# Patient Record
Sex: Male | Born: 2000 | Race: White | Hispanic: No
Health system: Southern US, Community
[De-identification: ages and names within clinical notes are randomized; demographics above are authoritative.]

## PROBLEM LIST (undated history)

## (undated) ENCOUNTER — Emergency Department (HOSPITAL_COMMUNITY): Admission: EM | Discharge status: Absent without leave | Payer: 59

## (undated) DIAGNOSIS — J45909 Unspecified asthma, uncomplicated: Secondary | ICD-10-CM

## (undated) DIAGNOSIS — I499 Cardiac arrhythmia, unspecified: Secondary | ICD-10-CM

## (undated) DIAGNOSIS — I10 Essential (primary) hypertension: Secondary | ICD-10-CM

## (undated) HISTORY — PX: RHINOPLASTY: SUR1284

## (undated) HISTORY — DX: Cardiac arrhythmia, unspecified: I49.9

## (undated) HISTORY — DX: Essential (primary) hypertension: I10

## (undated) HISTORY — DX: Unspecified asthma, uncomplicated: J45.909

---
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
## 2021-02-18 ENCOUNTER — Encounter: Payer: Self-pay | Admitting: Emergency Medicine

## 2021-02-18 ENCOUNTER — Ambulatory Visit (INDEPENDENT_AMBULATORY_CARE_PROVIDER_SITE_OTHER): Payer: 59 | Admitting: Emergency Medicine

## 2021-02-18 ENCOUNTER — Other Ambulatory Visit: Payer: Self-pay

## 2021-02-18 VITALS — BP 122/62 | HR 91 | Temp 98.2°F | Ht 69.0 in | Wt 182.0 lb

## 2021-02-18 DIAGNOSIS — R4582 Worries: Secondary | ICD-10-CM | POA: Diagnosis not present

## 2021-02-18 DIAGNOSIS — R300 Dysuria: Secondary | ICD-10-CM

## 2021-02-18 DIAGNOSIS — Z113 Encounter for screening for infections with a predominantly sexual mode of transmission: Secondary | ICD-10-CM | POA: Diagnosis not present

## 2021-02-18 LAB — POCT URINALYSIS DIP (MANUAL ENTRY)
Bilirubin, UA: NEGATIVE
Blood, UA: NEGATIVE
Glucose, UA: NEGATIVE mg/dL
Ketones, POC UA: NEGATIVE mg/dL
Leukocytes, UA: NEGATIVE
Nitrite, UA: NEGATIVE
Protein Ur, POC: NEGATIVE mg/dL
Spec Grav, UA: >=1.03 — AB (ref 1.010–1.025)
Urobilinogen, UA: 0.2 E.U./dL
pH, UA: 6 (ref 5.0–8.0)

## 2021-02-18 NOTE — Patient Instructions (Signed)
Preventing Sexually Transmitted Infections, Adult  Sexually transmitted infections (STIs) are diseases that are spread from person to person (are contagious). They are spread, or transmitted, through bodily fluids exchanged during sex or sexual contact. These bodily fluids include saliva, semen, blood, vaginal mucus, and urine. STIs are very common among people of all ages.  Some common STIs include:  Herpes.  Hepatitis B.  Chlamydia.  Gonorrhea.  Syphilis.  HPV (human papillomavirus).  HIV, also called the human immunodeficiency virus. This is the virus that can cause AIDS (acquired immunodeficiency syndrome).  Often, people who have these STIs do not have symptoms. Even without symptoms, these infections can be spread from person to person and require treatment.  How can these conditions affect me?  STIs can be treated, and many STIs can be cured. However, some STIs cannot be cured and will affect you for the rest of your life.  Certain STIs may:  Require you to take medicine for the rest of your life.  Affect your ability to have children (your fertility).  Increase your risk for developing another STI or certain serious health conditions. These may include:  Cervical cancer.  Head and neck cancer.  Pelvic inflammatory disease (PID), in women.  Organ damage or damage to other parts of your body, if the infection spreads.  Cause problems during pregnancy and may be transmitted to the baby during the pregnancy or childbirth.  What can increase my risk?  You may have an increased risk for developing an STI if:  You have unprotected sex. Sex includes oral, vaginal, or anal sex.  You have more than one sex partner.  You have a sex partner who has multiple sex partners.  You have sex with someone who has an STI.  You have an STI or you had an STI before.  You inject drugs or have a sex partner or partners who inject drugs.  What actions can I take to prevent STIs?  The only way to completely prevent STIs is not to have  sex of any kind. This is called practicing abstinence. If you are sexually active, you can protect yourself and others by taking these actions to lower your risk of getting an STI:  Lifestyle  Avoid mixing alcohol, drugs, and sex. Alcohol and drug use can affect your ability to make good decisions and can lead to risky sexual behaviors.  Medicines  Ask your health care provider about taking pre-exposure prophylaxis (PrEP) to prevent HIV infection.  General information    Stay up to date on vaccinations. Certain vaccines can lower your risk of getting certain STIs, such as:  Hepatitis A and hepatitis B vaccines. You may have been vaccinated as a young child, but you will likely need a booster shot as a teen or young adult.  HPV (human papillomavirus) vaccine.  Have only one sex partner (be monogamous) or limit the number of sex partners you have.  Use methods that prevent the exchange of body fluids between partners (barrier protection) correctly every time you have sex. Barrier protection can be used during oral, vaginal, or anal sex. Commonly used barrier methods include:  Male condom.  Male condom.  Dental dam.  Use a new condom for every sex act from start to finish.  Get tested for STIs. Have your partners get tested, too.  If you test positive for an STI, follow recommendations from your health care provider about treatment and make sure your sex partners are tested and treated.  Birth control   pills, injections, implants, and intrauterine devices (IUDs) do not protect against STIs. To prevent both STIs and pregnancy, always use a condom with another form of birth control.  Some STIs, such as herpes, are spread through skin-to-skin contact. A condom may not protect you from getting such STIs. Avoid all sexual contact if you or your partners have herpes and there is an active flare with open sores.  Where to find more information  Learn more about STIs from:  Centers for Disease Control and Prevention:  More  information about specific STIs: cdc.gov/std  Places to get sexual health counseling and treatment for free or at a low cost: gettested.cdc.gov  [place] Department of Health and Human Services: www.womenshealth.gov  Summary  Sexually transmitted infections (STIs) can spread through exchanging bodily fluids during sexual contact. Fluids include saliva, semen, blood, vaginal mucus, and urine.  You may have an increased risk for developing an STI if you have unprotected sex. Sex includes oral, vaginal, or anal sex.  If you do have sex, limit your number of sex partners and use barrier protection every time you have sex.  This information is not intended to replace advice given to you by your health care provider. Make sure you discuss any questions you have with your health care provider.  Document Revised: 05/02/2019 Document Reviewed: 05/02/2019  Elsevier Patient Education  2022 [employer]

## 2021-02-18 NOTE — Progress Notes (Signed)
Jackson Trevino 20 y.o.   Chief Complaint  Patient presents with   screening for STD    Dysuria ,x 2 weeks    HISTORY OF PRESENT ILLNESS: This is a 20 y.o. male concerned about possible STD. Married.  Wife had gonorrhea couple months ago. Denies penile discharge.  Occasional burning for the past couple weeks.  It comes and goes. No other associated symptoms.  HPI   Prior to Admission medications   Not on File    No Known Allergies  Patient Active Problem List   Diagnosis Date Noted   Asperger's disorder 09/06/2020   Chronic depression 09/06/2020   Connective tissue disorder ([place]) 09/06/2020   Abnormal colonoscopy 02/28/2019   Elevated blood pressure reading 02/28/2019   Gastroesophageal reflux disease with esophagitis without hemorrhage 02/28/2019   S/P rhinoplasty 12/08/2017   Chigger bites 10/18/2012   Body mass index, pediatric, 5th percentile to less than 85th percentile for age 66/27/2014   Constipation 03/13/2011   History of gastroesophageal reflux (GERD) 03/13/2011   History of rectal bleeding 03/13/2011   ADHD (attention deficit hyperactivity disorder) 07/10/2009   Childhood tic disorder 05/04/2008   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  No past surgical history on file.  Social History   Socioeconomic History   Marital status: Married    Spouse name: Not on file   Number of children: Not on file   Years of education: Not on file   Highest education level: Not on file  Occupational History   Not on file  Tobacco Use   Smoking status: Never   Smokeless tobacco: Never  Substance and Sexual Activity   Alcohol use: Not on file   Drug use: Not on file   Sexual activity: Not on file  Other Topics Concern   Not on file  Social History Narrative   Not on file   Social Determinants of Health   Financial Resource Strain: Not on file  Food Insecurity: Not on file  Transportation  Needs: Not on file  Physical Activity: Not on file  Stress: Not on file  Social Connections: Not on file  Intimate Partner Violence: Not on file    No family history on file.   Review of Systems  Constitutional: Negative.  Negative for chills and fever.  HENT: Negative.  Negative for congestion and sore throat.   Respiratory: Negative.  Negative for cough and shortness of breath.   Cardiovascular: Negative.  Negative for chest pain and palpitations.  Gastrointestinal:  Negative for abdominal pain, diarrhea, nausea and vomiting.  Genitourinary:  Positive for dysuria. Negative for flank pain, frequency and hematuria.  Skin: Negative.  Negative for rash.  Neurological: Negative.  Negative for dizziness and headaches.  All other systems reviewed and are negative. Today's Vitals   02/18/21 0849  BP: 122/62  Pulse: 91  Temp: 98.2 F (36.8 C)  TempSrc: Oral  SpO2: 99%  Weight: 182 lb (82.6 kg)  Height: 5\' 9"  (1.753 m)   Body mass index is 26.88 kg/m.   Physical Exam Vitals reviewed.  Constitutional:      Appearance: Normal appearance.  HENT:     Head: Normocephalic.  Eyes:     Extraocular Movements: Extraocular movements intact.  Cardiovascular:     Rate and Rhythm: Normal rate.  Pulmonary:     Effort: Pulmonary effort is normal.  Musculoskeletal:        General: Normal range of motion.  Cervical back: Normal range of motion and neck supple.  Skin:    General: Skin is warm and dry.  Neurological:     General: No focal deficit present.     Mental Status: He is alert and oriented to person, place, and time.  Psychiatric:        Mood and Affect: Mood normal.        Behavior: Behavior normal.   Results for orders placed or performed in visit on 02/18/21 (from the past 24 hour(s))  POCT urinalysis dipstick     Status: Abnormal   Collection Time: 02/18/21  9:20 AM  Result Value Ref Range   Color, UA yellow yellow   Clarity, UA clear clear   Glucose, UA negative  negative mg/dL   Bilirubin, UA negative negative   Ketones, POC UA negative negative mg/dL   Spec Grav, UA >=1.030 (A) 1.010 - 1.025   Blood, UA negative negative   pH, UA 6.0 5.0 - 8.0   Protein Ur, POC negative negative mg/dL   Urobilinogen, UA 0.2 0.2 or 1.0 E.U./dL   Nitrite, UA Negative Negative   Leukocytes, UA Negative Negative      ASSESSMENT & PLAN: A total of 30 minutes was spent with the patient and counseling/coordination of care regarding preparing for this visit, review of most recent office visit notes, differential diagnosis of dysuria, management and prevention of STDs, review of urinalysis results, prognosis, documentation and need for blood work and follow-up.  Clinically stable.  No signs of active urinary infection. STD screening done today. No red flag signs or symptoms.  Problem List Items Addressed This Visit   None Visit Diagnoses     Dysuria    -  Primary   Screening for STD (sexually transmitted disease)       Relevant Orders   POCT urinalysis dipstick (Completed)   Urine Culture   RPR   HIV Antibody (routine testing w rflx)   Hepatitis B surface antigen   HCV Ab w Reflex to Quant PCR   GC/Chlamydia Probe Amp   Worries          Patient Instructions  Preventing Sexually Transmitted Infections, Adult Sexually transmitted infections (STIs) are diseases that are spread from person to person (are contagious). They are spread, or transmitted, through bodily fluids exchanged during sex or sexual contact. These bodily fluids include saliva, semen, blood, vaginal mucus, and urine. STIs are very common among people of all ages. Some common STIs include: Herpes. Hepatitis B. Chlamydia. Gonorrhea. Syphilis. HPV (human papillomavirus). HIV, also called the human immunodeficiency virus. This is the virus that can cause AIDS (acquired immunodeficiency syndrome). Often, people who have these STIs do not have symptoms. Even without symptoms, these  infections can be spread from person to person and require treatment. How can these conditions affect me? STIs can be treated, and many STIs can be cured. However, some STIs cannot be cured and will affect you for the rest of your life. Certain STIs may: Require you to take medicine for the rest of your life. Affect your ability to have children (your fertility). Increase your risk for developing another STI or certain serious health conditions. These may include: Cervical cancer. Head and neck cancer. Pelvic inflammatory disease (PID), in women. Organ damage or damage to other parts of your body, if the infection spreads. Cause problems during pregnancy and may be transmitted to the baby during the pregnancy or childbirth. What can increase my risk? You may have an  increased risk for developing an STI if: You have unprotected sex. Sex includes oral, vaginal, or anal sex. You have more than one sex partner. You have a sex partner who has multiple sex partners. You have sex with someone who has an STI. You have an STI or you had an STI before. You inject drugs or have a sex partner or partners who inject drugs. What actions can I take to prevent STIs? The only way to completely prevent STIs is not to have sex of any kind. This is called practicing abstinence. If you are sexually active, you can protect yourself and others by taking these actions to lower your risk of getting an STI: Lifestyle Avoid mixing alcohol, drugs, and sex. Alcohol and drug use can affect your ability to make good decisions and can lead to risky sexual behaviors. Medicines Ask your health care provider about taking pre-exposure prophylaxis (PrEP) to prevent HIV infection. General information  Stay up to date on vaccinations. Certain vaccines can lower your risk of getting certain STIs, such as: Hepatitis A and hepatitis B vaccines. You may have been vaccinated as a young child, but you will likely need a booster  shot as a teen or young adult. HPV (human papillomavirus) vaccine. Have only one sex partner (be monogamous) or limit the number of sex partners you have. Use methods that prevent the exchange of body fluids between partners (barrier protection) correctly every time you have sex. Barrier protection can be used during oral, vaginal, or anal sex. Commonly used barrier methods include: Male condom. Male condom. Dental dam. Use a new condom for every sex act from start to finish. Get tested for STIs. Have your partners get tested, too. If you test positive for an STI, follow recommendations from your health care provider about treatment and make sure your sex partners are tested and treated. Birth control pills, injections, implants, and intrauterine devices (IUDs) do not protect against STIs. To prevent both STIs and pregnancy, always use a condom with another form of birth control. Some STIs, such as herpes, are spread through skin-to-skin contact. A condom may not protect you from getting such STIs. Avoid all sexual contact if you or your partners have herpes and there is an active flare with open sores. Where to find more information Learn more about STIs from: Centers for Disease Control and Prevention: More information about specific STIs: BloggingList.ca Places to get sexual health counseling and treatment for free or at a low cost: gettested.TonerPromos.no U.S. Department of Health and Human Services: http://hoffman.com/ Summary Sexually transmitted infections (STIs) can spread through exchanging bodily fluids during sexual contact. Fluids include saliva, semen, blood, vaginal mucus, and urine. You may have an increased risk for developing an STI if you have unprotected sex. Sex includes oral, vaginal, or anal sex. If you do have sex, limit your number of sex partners and use barrier protection every time you have sex. This information is not intended to replace advice given to you by your health  care provider. Make sure you discuss any questions you have with your health care provider. Document Revised: 05/02/2019 Document Reviewed: 05/02/2019 Elsevier Patient Education  2022 Elsevier Inc.     Edwina Barth, MD [facility] at [facility]

## 2021-02-19 LAB — HIV ANTIBODY (ROUTINE TESTING W REFLEX): HIV 1&2 Ab, 4th Generation: NONREACTIVE

## 2021-02-19 LAB — HCV AB W REFLEX TO QUANT PCR: HCV Ab: <0.1 s/co ratio (ref 0.0–0.9)

## 2021-02-19 LAB — URINE CULTURE

## 2021-02-19 LAB — HCV INTERPRETATION

## 2021-02-19 LAB — RPR: RPR Ser Ql: NONREACTIVE

## 2021-02-19 LAB — HEPATITIS B SURFACE ANTIGEN: Hepatitis B Surface Ag: NONREACTIVE

## 2021-02-20 LAB — GC/CHLAMYDIA PROBE AMP
Chlamydia trachomatis, NAA: NEGATIVE
Neisseria Gonorrhoeae by PCR: NEGATIVE

## 2021-06-10 ENCOUNTER — Ambulatory Visit (INDEPENDENT_AMBULATORY_CARE_PROVIDER_SITE_OTHER): Payer: 59 | Admitting: Emergency Medicine

## 2021-06-10 ENCOUNTER — Other Ambulatory Visit: Payer: Self-pay

## 2021-06-10 ENCOUNTER — Encounter: Payer: Self-pay | Admitting: Emergency Medicine

## 2021-06-10 VITALS — BP 130/62 | HR 84 | Ht 69.0 in | Wt 180.0 lb

## 2021-06-10 DIAGNOSIS — H9193 Unspecified hearing loss, bilateral: Secondary | ICD-10-CM | POA: Insufficient documentation

## 2021-06-10 HISTORY — DX: Unspecified hearing loss, bilateral: H91.93

## 2021-06-10 NOTE — Patient Instructions (Signed)
Hearing Loss ?Hearing loss is a partial or total loss of the ability to hear. This can be temporary or permanent, and it can happen in one or both ears. ?Medical care is necessary to treat hearing loss properly and to prevent the condition from getting worse. Your hearing may partially or completely come back, depending on what caused your hearing loss and how severe it is. In some cases, hearing loss is permanent. ?What are the causes? ?Common causes of hearing loss include: ?Too much wax in the ear canal. ?Infection of the ear canal or middle ear. ?Fluid in the middle ear. ?Injury to the ear or surrounding area. ?An object stuck in the ear. ?A history of prolonged exposure to loud sounds, such as music. ?Less common causes of hearing loss include: ?Tumors in the ear. ?Viral or bacterial infections, such as meningitis. ?A hole in the eardrum (perforated eardrum). ?Problems with the hearing nerve that sends signals between the brain and the ear. ?Certain medicines. ?What are the signs or symptoms? ?Symptoms of this condition may include: ?Difficulty telling the difference between sounds. ?Difficulty following a conversation when there is background noise. ?Lack of response to sounds in your environment. This may be most noticeable when you do not respond to startling sounds. ?Needing to turn up the volume on the television, radio, or other devices. ?Ringing in the ears. ?Dizziness. ?How is this diagnosed? ?This condition is diagnosed based on: ?A physical exam. ?A hearing test (audiometry). The audiometry test will be performed by a hearing specialist (audiologist). ?You may also be referred to an ear, nose, and throat (ENT) specialist (otolaryngologist). ?How is this treated? ?Treatment for hearing loss may include: ?Ear wax removal. ?Medicines to treat or prevent infection (antibiotics). ?Medicines to reduce inflammation (corticosteroids). ?Hearing aids for hearing loss related to nerve damage. ?Follow these  instructions at home: ?If you were prescribed an antibiotic medicine, take it as told by your health care provider. Do not stop taking the antibiotic even if you start to feel better. ?Take over-the-counter and prescription medicines only as told by your health care provider. ?Avoid loud noises. ?Return to your normal activities as told by your health care provider. Ask your health care provider what activities are safe for you. ?Keep all follow-up visits as told by your health care provider. This is important. ?Contact a health care provider if: ?You feel dizzy. ?You develop new symptoms. ?You vomit or feel nauseous. ?You have a fever. ?Get help right away if: ?You develop sudden changes in your vision. ?You have severe ear pain. ?You have new or increased weakness. ?You have a severe headache. ?Summary ?Hearing loss is a decreased ability to hear sounds around you. It can be temporary or permanent. ?Treatment will depend on the cause of your hearing loss. It may include ear wax removal, medicines, or a hearing aid. ?Your hearing may partially or completely come back, depending on what caused your hearing loss and how severe it is. ?Keep all follow-up visits as told by your health care provider. This is important. ?This information is not intended to replace advice given to you by your health care provider. Make sure you discuss any questions you have with your health care provider. ?Document Revised: 12/15/2017 Document Reviewed: 12/15/2017 ?Elsevier Patient Education ? 2022 Elsevier Inc. ? ?

## 2021-06-10 NOTE — Progress Notes (Signed)
[place] ?21 y.o. ? ? ?Chief Complaint  ?Patient presents with  ? Referral  ?  Audiology, pt states he is hard on hearing  ? ? ?HISTORY OF PRESENT ILLNESS: ?This is a 21 y.o. male complaining of hearing loss which was first detected in middle school. ?Requesting referral to audiology and/or ENT. ?No other complaints or medical concerns today. ? ?HPI ? ? ?Prior to Admission medications   ?Not on File  ? ? ?No Known Allergies ? ?Patient Active Problem List  ? Diagnosis Date Noted  ? Asperger's disorder 09/06/2020  ? Chronic depression 09/06/2020  ? Connective tissue disorder ([place]) 09/06/2020  ? Abnormal colonoscopy 02/28/2019  ? Elevated blood pressure reading 02/28/2019  ? Gastroesophageal reflux disease with esophagitis without hemorrhage 02/28/2019  ? S/P rhinoplasty 12/08/2017  ? Chigger bites 10/18/2012  ? Body mass index, pediatric, 5th percentile to less than 85th percentile for age 42/27/2014  ? Constipation 03/13/2011  ? History of gastroesophageal reflux (GERD) 03/13/2011  ? History of rectal bleeding 03/13/2011  ? ADHD (attention deficit hyperactivity disorder) 07/10/2009  ? Childhood tic disorder 05/04/2008  ? 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 ? ?No past medical history on file. ? ?No past surgical history on file. ? ?Social History  ? ?Socioeconomic History  ? Marital status: Married  ?  Spouse name: Not on file  ? Number of children: Not on file  ? Years of education: Not on file  ? Highest education level: Not on file  ?Occupational History  ? Not on file  ?Tobacco Use  ? Smoking status: Never  ? Smokeless tobacco: Never  ?Substance and Sexual Activity  ? Alcohol use: Not on file  ? Drug use: Not on file  ? Sexual activity: Not on file  ?Other Topics Concern  ? Not on file  ?Social History Narrative  ? Not on file  ? ?Social Determinants of Health  ? ?Financial Resource Strain: Not on file  ?Food Insecurity: Not on file  ?Transportation Needs: Not on file   ?Physical Activity: Not on file  ?Stress: Not on file  ?Social Connections: Not on file  ?Intimate Partner Violence: Not on file  ? ? ?No family history on file. ? ? ?Review of Systems  ?Constitutional: Negative.  Negative for chills and fever.  ?HENT:  Positive for hearing loss.   ?Respiratory: Negative.    ?Cardiovascular: Negative.   ?Gastrointestinal:  Negative for nausea and vomiting.  ?Skin: Negative.  Negative for rash.  ?Neurological:  Negative for dizziness and headaches.  ?All other systems reviewed and are negative. ?Today's Vitals  ? 06/10/21 1553  ?BP: 130/62  ?Pulse: 84  ?SpO2: 99%  ?Weight: 180 lb (81.6 kg)  ?Height: 5\' 9"  (1.753 m)  ? ?Body mass index is 26.58 kg/m?. ? ? ?Physical Exam ?Vitals reviewed.  ?Constitutional:   ?   Appearance: Normal appearance.  ?HENT:  ?   Head: Normocephalic.  ?   Right Ear: Tympanic membrane, ear canal and external ear normal.  ?   Left Ear: Tympanic membrane, ear canal and external ear normal.  ?Eyes:  ?   Extraocular Movements: Extraocular movements intact.  ?Cardiovascular:  ?   Rate and Rhythm: Normal rate.  ?Pulmonary:  ?   Effort: Pulmonary effort is normal.  ?Skin: ?   General: Skin is warm and dry.  ?   Capillary Refill: Capillary refill takes less than 2 seconds.  ?Neurological:  ?   General: No focal deficit  present.  ?   Mental Status: He is alert and oriented to person, place, and time.  ?Psychiatric:     ?   Mood and Affect: Mood normal.     ?   Behavior: Behavior normal.  ? ? ? ?ASSESSMENT & PLAN: ?Problem List Items Addressed This Visit   ? ?  ? Nervous and Auditory  ? Bilateral hearing loss - Primary  ? Relevant Orders  ? Ambulatory referral to ENT  ? Ambulatory referral to Audiology  ? ?Patient Instructions  ?Hearing Loss ?Hearing loss is a partial or total loss of the ability to hear. This can be temporary or permanent, and it can happen in one or both ears. ?Medical care is necessary to treat hearing loss properly and to prevent the condition from  getting worse. Your hearing may partially or completely come back, depending on what caused your hearing loss and how severe it is. In some cases, hearing loss is permanent. ?What are the causes? ?Common causes of hearing loss include: ?Too much wax in the ear canal. ?Infection of the ear canal or middle ear. ?Fluid in the middle ear. ?Injury to the ear or surrounding area. ?An object stuck in the ear. ?A history of prolonged exposure to loud sounds, such as music. ?Less common causes of hearing loss include: ?Tumors in the ear. ?Viral or bacterial infections, such as meningitis. ?A hole in the eardrum (perforated eardrum). ?Problems with the hearing nerve that sends signals between the brain and the ear. ?Certain medicines. ?What are the signs or symptoms? ?Symptoms of this condition may include: ?Difficulty telling the difference between sounds. ?Difficulty following a conversation when there is background noise. ?Lack of response to sounds in your environment. This may be most noticeable when you do not respond to startling sounds. ?Needing to turn up the volume on the television, radio, or other devices. ?Ringing in the ears. ?Dizziness. ?How is this diagnosed? ?This condition is diagnosed based on: ?A physical exam. ?A hearing test (audiometry). The audiometry test will be performed by a hearing specialist (audiologist). ?You may also be referred to an ear, nose, and throat (ENT) specialist (otolaryngologist). ?How is this treated? ?Treatment for hearing loss may include: ?Ear wax removal. ?Medicines to treat or prevent infection (antibiotics). ?Medicines to reduce inflammation (corticosteroids). ?Hearing aids for hearing loss related to nerve damage. ?Follow these instructions at home: ?If you were prescribed an antibiotic medicine, take it as told by your health care provider. Do not stop taking the antibiotic even if you start to feel better. ?Take over-the-counter and prescription medicines only as told by  your health care provider. ?Avoid loud noises. ?Return to your normal activities as told by your health care provider. Ask your health care provider what activities are safe for you. ?Keep all follow-up visits as told by your health care provider. This is important. ?Contact a health care provider if: ?You feel dizzy. ?You develop new symptoms. ?You vomit or feel nauseous. ?You have a fever. ?Get help right away if: ?You develop sudden changes in your vision. ?You have severe ear pain. ?You have new or increased weakness. ?You have a severe headache. ?Summary ?Hearing loss is a decreased ability to hear sounds around you. It can be temporary or permanent. ?Treatment will depend on the cause of your hearing loss. It may include ear wax removal, medicines, or a hearing aid. ?Your hearing may partially or completely come back, depending on what caused your hearing loss and how severe it is. ?  Keep all follow-up visits as told by your health care provider. This is important. ?This information is not intended to replace advice given to you by your health care provider. Make sure you discuss any questions you have with your health care provider. ?Document Revised: 12/15/2017 Document Reviewed: 12/15/2017 ?Elsevier Patient Education ? 2022 [place]. ? ? ? ? ?Agustina Caroli, MD ?[facility] at [facility] ?

## 2021-06-19 ENCOUNTER — Ambulatory Visit: Payer: 59 | Attending: Emergency Medicine | Admitting: Audiologist

## 2021-06-19 ENCOUNTER — Other Ambulatory Visit: Payer: Self-pay

## 2021-06-19 DIAGNOSIS — H903 Sensorineural hearing loss, bilateral: Secondary | ICD-10-CM | POA: Insufficient documentation

## 2021-06-19 NOTE — Procedures (Signed)
?  Outpatient [facility] and [place] ?[address] ?[place], [place]  [address] ?[phone] ? ?AUDIOLOGICAL  EVALUATION ? ?NAME: Jackson Trevino     ?DOB:   12-31-2000      ?MRN: [id number]                                                                                     ?DATE: 06/19/2021     ?REFERENT: Horald Pollen, MD ?STATUS: Outpatient ?DIAGNOSIS: Sensorineural Hearing Loss Bilateral   ? ?History: ?Shoichi was seen for an audiological evaluation.  ?[name] is receiving a hearing evaluation due to concerns for difficulty hearing at work over the radio and in loud noise. [name] has difficulty hearing in background noise, crowds, and when people are at a distance. This difficulty began in middle school. [name] says he was diagnosed with slight hearing loss in middle school. He was told its related to his connective tissue disorder. No pain or pressure reported in either ear. Tinnitus present in intermittently in both ears. No other relevant case history reported.  ? ?Evaluation:  ?Otoscopy showed a clear view of the tympanic membranes, bilaterally ?Tympanometry results were consistent with normal middle ear function, bilaterally   ?Audiometric testing was completed using conventional audiometry with insert transducer. Speech Recognition Thresholds were consistent with pure tone averages. Word Recognition was excellent at 40dB SL with masking. Pure tone thresholds show normal sloping to mild notched sensorineural hearing loss in both ears centered on 1k Hz. Test results are consistent with mild degree of loss in mid pitches. ? ?Results:  ?The test results were reviewed with Tomasz. He has a mild hearing loss in both ears. Due to the noisy environment he works in, and needing to hear over a low quality radio, this mild degree of loss is likely impacting him daily. He was given information about pursuing hearing aids even though his degree of hearing loss does not make him a traditional  candidate. Whether or not he gets hearing aids, Asante needs to have his hearing tested annually to monitor his hearing loss progression. He reported understanding all that was discussed. He was given a copy of hearing test today. ? ?Recommendations: ?Recommend annual audiometric evaluation to monitor progressive hearing loss.  ?Due to difficulty at work, Bartolome was given a list of local hearing aid providers and a handout on quality over the counter hearing aids currently on the market.  ?  ?Alfonse Alpers  ?Audiologist, Au.D., CCC-A ?06/19/2021  8:35 AM ? ?Cc: Horald Pollen, MD  ?

## 2021-07-17 ENCOUNTER — Telehealth: Payer: 59 | Admitting: Nurse Practitioner

## 2021-07-17 DIAGNOSIS — J189 Pneumonia, unspecified organism: Secondary | ICD-10-CM

## 2021-07-17 MED ORDER — AZITHROMYCIN 250 MG PO TABS: ORAL_TABLET | ORAL | 0 refills | Status: AC

## 2021-07-17 MED ORDER — ALBUTEROL SULFATE HFA 108 (90 BASE) MCG/ACT IN AERS: 2.0000 | INHALATION_SPRAY | Freq: Four times a day (QID) | RESPIRATORY_TRACT | 0 refills | Status: DC | PRN

## 2021-07-17 NOTE — Progress Notes (Signed)
?Virtual Visit Consent  ? ?Jackson Trevino, you are scheduled for a virtual visit with a [facility] provider today.   ?  ?Just as with appointments in the office, your consent must be obtained to participate.  Your consent will be active for this visit and any virtual visit you may have with one of our providers in the next 365 days.   ?  ?If you have a MyChart account, a copy of this consent can be sent to you electronically.  All virtual visits are billed to your insurance company just like a traditional visit in the office.   ? ?As this is a virtual visit, video technology does not allow for your provider to perform a traditional examination.  This may limit your provider's ability to fully assess your condition.  If your provider identifies any concerns that need to be evaluated in person or the need to arrange testing (such as labs, EKG, etc.), we will make arrangements to do so.   ?  ?Although advances in technology are sophisticated, we cannot ensure that it will always work on either your end or our end.  If the connection with a video visit is poor, the visit may have to be switched to a telephone visit.  With either a video or telephone visit, we are not always able to ensure that we have a secure connection.    ? ?I need to obtain your verbal consent now.   Are you willing to proceed with your visit today?  ?  ?[name] has provided verbal consent on 07/17/2021 for a virtual visit (video or telephone). ?  ?Viviano Simas, FNP  ? ?Date: 07/17/2021 9:08 AM ? ? ?Virtual Visit via Video Note  ? ?IViviano Simas, connected with  Jackson Trevino  ([id number], 10-05-2000) on 07/17/21 at  9:15 AM EDT by a video-enabled telemedicine application and verified that I am speaking with the correct person using two identifiers. ? ?Location: ?Patient: Virtual Visit Location Patient: Home ?Provider: Virtual Visit Location Provider: Home Office ?  ?I discussed the limitations of evaluation and  management by telemedicine and the availability of in person appointments. The patient expressed understanding and agreed to proceed.   ? ?History of Present Illness: ?Jackson Trevino is a 21 y.o. who identifies as a male who was assigned male at birth, and is being seen today with complaints of a sore throat, cough and nasal congestion for the past 4 days. He has started to experience some chest discomfort. He has tried dayquil for relief.  ? ?He did have asthma as a child  ?He denies a fever  ? 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 He was in contact with the patients with and without a mask.  ? ?He started to have symptoms within a day of contact with that patient  he was unmasked around that was diagnosed with CAP ? ?Denies COVID contacts  ?He has not been COVID tested  ? ? ? ? ?Problems:  ?Patient Active Problem List  ? Diagnosis Date Noted  ? Bilateral hearing loss 06/10/2021  ? Asperger's disorder 09/06/2020  ? Chronic depression 09/06/2020  ? Connective tissue disorder (HCC) 09/06/2020  ? Abnormal colonoscopy 02/28/2019  ? Elevated blood pressure reading 02/28/2019  ? Gastroesophageal reflux disease with esophagitis without hemorrhage 02/28/2019  ? S/P rhinoplasty 12/08/2017  ? Chigger bites 10/18/2012  ? Body mass index, pediatric, 5th percentile to less  than 85th percentile for age 74/27/2014  ? Constipation 03/13/2011  ? History of gastroesophageal reflux (GERD) 03/13/2011  ? History of rectal bleeding 03/13/2011  ? ADHD (attention deficit hyperactivity disorder) 07/10/2009  ? Childhood tic disorder 05/04/2008  ? 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  ?Allergies: No Known Allergies ?Medications: No current outpatient medications on file. ? ?Observations/Objective: ?Patient is well-developed, well-nourished in no acute distress.  ?Resting comfortably  at home.  ?Head is normocephalic, atraumatic.  ?No labored breathing.  ?Speech  is clear and coherent with logical content.  ?Patient is alert and oriented at baseline.  ? ? ?Assessment and Plan: ?1. Pneumonia due to infectious organism, unspecified laterality, unspecified part of lung ?Advised Mucinex OTC in addition to: ? ?- albuterol (VENTOLIN HFA) 108 (90 Base) MCG/ACT inhaler; Inhale 2 puffs into the lungs every 6 (six) hours as needed for wheezing or shortness of breath.  Dispense: 8 g; Refill: 0 ?- azithromycin (ZITHROMAX) 250 MG tablet; Take 2 tablets on day 1, then 1 tablet daily on days 2 through 5  Dispense: 6 tablet; Refill: 0 ?   ?Advised COVID testing as well  ?Follow Up Instructions: ?I discussed the assessment and treatment plan with the patient. The patient was provided an opportunity to ask questions and all were answered. The patient agreed with the plan and demonstrated an understanding of the instructions.  A copy of instructions were sent to the patient via MyChart unless otherwise noted below.  ? ?The patient was advised to call back or seek an in-person evaluation if the symptoms worsen or if the condition fails to improve as anticipated. ? ?Time:  ?I spent 10 minutes with the patient via telehealth technology discussing the above problems/concerns.   ? ?Viviano Simas, FNP  ?

## 2021-07-24 ENCOUNTER — Ambulatory Visit (INDEPENDENT_AMBULATORY_CARE_PROVIDER_SITE_OTHER): Payer: 59 | Admitting: Gastroenterology

## 2021-07-24 ENCOUNTER — Encounter: Payer: Self-pay | Admitting: Gastroenterology

## 2021-07-24 VITALS — BP 172/79 | HR 71 | Temp 98.0°F | Ht 69.0 in | Wt 179.0 lb

## 2021-07-24 DIAGNOSIS — K582 Mixed irritable bowel syndrome: Secondary | ICD-10-CM | POA: Diagnosis not present

## 2021-07-24 MED ORDER — DICYCLOMINE HCL 20 MG PO TABS: 20.0000 mg | ORAL_TABLET | Freq: Three times a day (TID) | ORAL | 2 refills | Status: DC

## 2021-07-24 NOTE — Progress Notes (Signed)
? ? ?Gastroenterology Consultation ? ?Referring Provider:     Georgina Quint, * ?Primary Care Physician:  Georgina Quint, MD ?Primary Gastroenterologist:  Dr. Servando Snare     ?Reason for Consultation:     Irritable bowel syndrome ?      ? HPI:   ?Jackson Trevino is a 21 y.o. y/o male referred for consultation & management of irritable bowel syndrome by Dr. Alvy Bimler, Eilleen Kempf, MD. this patient comes in today with a history of irritable bowel syndrome that he states has been going on since he was 21 years old.  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  He also has bloating and abdominal pain.  He has no family history of colon cancer or colon polyps.  The patient also denies any unexplained weight loss fevers chills nausea or vomiting.  The patient is on Prilosec for heartburn.  He has never been tried on any antispasmodic medication and states he is looking for something to help with these frequent changes in his bowel movements. ? ?No past medical history on file. ? ?No past surgical history on file. ? ?Prior to Admission medications   ?Medication Sig Start Date End Date Taking? Authorizing Provider  ?albuterol (VENTOLIN HFA) 108 (90 Base) MCG/ACT inhaler Inhale 2 puffs into the lungs every 6 (six) hours as needed for wheezing or shortness of breath. 07/17/21   Viviano Simas, FNP  ? ? ?No family history on file.  ? ?Social History  ? ?Tobacco Use  ? Smoking status: Never  ? Smokeless tobacco: Never  ? ? ?Allergies as of 07/24/2021 - Review Complete 07/17/2021  ?Allergen Reaction Noted  ? Azithromycin Hives and Itching 07/24/2021  ? ? ?Review of Systems:    ?All systems reviewed and negative except where noted in HPI. ? ? Physical Exam:  ?BP (!) 172/79   Pulse 71   Temp 98 ?F (36.7 ?C) (Oral)   Ht 5\' 9"  (1.753 m)   Wt 179 lb  (81.2 kg)   BMI 26.43 kg/m?  ?No LMP for male patient. ?General:   Alert,  Well-developed, well-nourished, pleasant and cooperative in NAD ?Head:  Normocephalic and atraumatic. ?Eyes:  Sclera clear, no icterus.   Conjunctiva pink. ?Ears:  Normal auditory acuity. ?Neck:  Supple; no masses or thyromegaly. ?Lungs:  Respirations even and unlabored.  Clear throughout to auscultation.   No wheezes, crackles, or rhonchi. No acute distress. ?Heart:  Regular rate and rhythm; no murmurs, clicks, rubs, or gallops. ?Abdomen:  Normal bowel sounds.  No bruits.  Soft, non-tender and non-distended without masses, hepatosplenomegaly or hernias noted.  No guarding or rebound tenderness.  Negative Carnett sign.   ?Rectal:  Deferred.  ?Pulses:  Normal pulses noted. ?Extremities:  No clubbing or edema.  No cyanosis. ?Neurologic:  Alert and oriented x3;  grossly normal neurologically. ?Skin:  Intact without significant lesions or rashes.  No jaundice. ?Lymph Nodes:  No significant cervical adenopathy. ?Psych:  Alert and cooperative. Normal mood and affect. ? ?Imaging Studies: ?No results found. ? ?Assessment and Plan:  ? ?Czar Kalyb Pemble is a 21 y.o. y/o male who comes in today with signs and symptoms of irritable bowel syndrome with alternating diarrhea and constipation and bloating.  The patient has been informed about a low FODMAP diet.  The patient has also been told to start a probiotic and start a fiber supplementation with Citrucel.  The patient will also be started on dicyclomine 20 mg 3 times a day. The patient has also been told that if these do not work he may be considered for a tricyclic antidepressant before bedtime to help with his irritable bowel syndrome.  The patient has been explained the plan and agrees with it.  He will contact me if his symptoms do not improve. ? ? ? ?Midge Minium, MD. Clementeen Graham ? ? ? Note: This dictation was prepared with Dragon dictation along with smaller phrase technology. Any transcriptional  errors that result from this process are unintentional.   ?

## 2021-08-08 ENCOUNTER — Other Ambulatory Visit: Payer: Self-pay | Admitting: Nurse Practitioner

## 2021-08-08 DIAGNOSIS — J189 Pneumonia, unspecified organism: Secondary | ICD-10-CM

## 2021-08-15 ENCOUNTER — Ambulatory Visit: Payer: 59 | Admitting: Physician Assistant

## 2021-08-15 ENCOUNTER — Encounter: Payer: Self-pay | Admitting: Physician Assistant

## 2021-08-15 VITALS — BP 124/73 | HR 66 | Temp 97.7°F | Ht 69.0 in | Wt 177.0 lb

## 2021-08-15 DIAGNOSIS — K582 Mixed irritable bowel syndrome: Secondary | ICD-10-CM

## 2021-08-15 DIAGNOSIS — F909 Attention-deficit hyperactivity disorder, unspecified type: Secondary | ICD-10-CM

## 2021-08-15 DIAGNOSIS — Z7689 Persons encountering health services in other specified circumstances: Secondary | ICD-10-CM | POA: Diagnosis not present

## 2021-08-15 MED ORDER — AMPHETAMINE-DEXTROAMPHET ER 10 MG PO CP24: 10.0000 mg | ORAL_CAPSULE | Freq: Every day | ORAL | 0 refills | Status: DC

## 2021-08-15 NOTE — Patient Instructions (Signed)
Living With Attention Deficit Hyperactivity Disorder If you have been diagnosed with attention deficit hyperactivity disorder (ADHD), you may be relieved that you now know why you have felt or behaved a certain way. Still, you may feel overwhelmed about the treatment ahead. You may also wonder how to get the support you need and how to deal with the condition day-to-day. With treatment and support, you can live with ADHD and manage your symptoms. How to manage lifestyle changes Managing stress Stress is your body's reaction to life changes and events, both good and bad. To cope with the stress of an ADHD diagnosis, it may help to: Learn more about ADHD. Exercise regularly. Even a short daily walk can lower stress levels. Participate in training or education programs (including social skills training classes) that teach you to deal with symptoms.  Medicines Your health care provider may suggest certain medicines if he or she feels that they will help to improve your condition. Stimulant medicines are usually prescribed to treat ADHD, and therapy may also be prescribed. It is important to: Avoid using alcohol and other substances that may prevent your medicines from working properly (may interact). Talk with your pharmacist or health care provider about all the medicines that you take, their possible side effects, and what medicines are safe to take together. Make it your goal to take part in all treatment decisions (shared decision-making). Ask about possible side effects of medicines that your health care provider recommends, and tell him or her how you feel about having those side effects. It is best if shared decision-making with your health care provider is part of your total treatment plan. Relationships To strengthen your relationships with family members while treating your condition, consider taking part in family therapy. You might also attend self-help groups alone or with a loved one. Be  honest about how your symptoms affect your relationships. Make an effort to communicate respectfully instead of fighting, and find ways to show others that you care. Psychotherapy may be useful in helping you cope with how ADHD affects your relationships. How to recognize changes in your condition The following signs may mean that your treatment is working well and your condition is improving: Consistently being on time for appointments. Being more organized at home and work. Other people noticing improvements in your behavior. Achieving goals that you set for yourself. Thinking more clearly. The following signs may mean that your treatment is not working very well: Feeling impatience or more confusion. Missing, forgetting, or being late for appointments. An increasing sense of disorganization and messiness. More difficulty in reaching goals that you set for yourself. Loved ones becoming angry or frustrated with you. Follow these instructions at home: Take over-the-counter and prescription medicines only as told by your health care provider. Check with your health care provider before taking any new medicines. Create structure and an organized atmosphere at home. For example: Make a list of tasks, then rank them from most important to least important. Work on one task at a time until your listed tasks are done. Make a daily schedule and follow it consistently every day. Use an appointment calendar, and check it 2 or 3 times a day to keep on track. Keep it with you when you leave the house. Create spaces where you keep certain things, and always put things back in their places after you use them. Keep all follow-up visits as told by your health care provider. This is important. Where to find support Talking to others    Keep emotion out of important discussions and speak in a calm, logical way. Listen closely and patiently to your loved ones. Try to understand their point of view, and try to  avoid getting defensive. Take responsibility for the consequences of your actions. Ask that others do not take your behaviors personally. Aim to solve problems as they come up, and express your feelings instead of bottling them up. Talk openly about what you need from your loved ones and how they can support you. Consider going to family therapy sessions or having your family meet with a specialist who deals with ADHD-related behavior problems. Finances Not all insurance plans cover mental health care, so it is important to check with your insurance carrier. If paying for co-pays or counseling services is a problem, search for a local or county mental health care center. Public mental health care services may be offered there at a low cost or no cost when you are not able to see a private health care provider. If you are taking medicine for ADHD, you may be able to get the generic form, which may be less expensive than brand-name medicine. Some makers of prescription medicines also offer help to patients who cannot afford the medicines that they need. Questions to ask your health care provider: What are the risks and benefits of taking medicines? Would I benefit from therapy? How often should I follow up with a health care provider? Contact a health care provider if: You have side effects from your medicines, such as: Repeated muscle twitches, coughing, or speech outbursts. Sleep problems. Loss of appetite. Depression. New or worsening behavior problems. Dizziness. Unusually fast heartbeat. Stomach pains. Headaches. Get help right away if: You have a severe reaction to a medicine. Your behavior suddenly gets worse. Summary With treatment and support, you can live with ADHD and manage your symptoms. The medicines that are most often prescribed for ADHD are stimulants. Consider taking part in family therapy or self-help groups with family members or friends. When you talk with friends  and family about your ADHD, be patient and communicate openly. Take over-the-counter and prescription medicines only as told by your health care provider. Check with your health care provider before taking any new medicines. This information is not intended to replace advice given to you by your health care provider. Make sure you discuss any questions you have with your health care provider. Document Revised: 07/29/2019 Document Reviewed: 08/31/2019 Elsevier Patient Education  2023 Elsevier Inc.  

## 2021-08-15 NOTE — Progress Notes (Signed)
New Patient Office Visit  Subjective    Patient ID: Jackson Trevino, male    DOB: 01/26/2001  Age: 21 y.o. MRN: [id number]  CC:  Chief Complaint  Patient presents with   New Patient (Initial Visit)    HPI Jackson Trevino presents to establish care. Patient has a past medical hx of IBS with both constipation and diarrhea, sees a gastroenterologist. Also reports takes omeprazole 20 mg daily for acid reflux. Does not take other medications or supplements. Patient requesting referral to psychiatry. States was diagnosed with ADHD at the age of 81. Was given a medication that made him sleepy so medication was stopped, but does not recall what it was. Reports has been struggling with focus and tends to be forgetful which is starting to affect him at work.      Outpatient Encounter Medications as of 08/15/2021  Medication Sig   albuterol (VENTOLIN HFA) 108 (90 Base) MCG/ACT inhaler Inhale 2 puffs into the lungs every 6 (six) hours as needed for wheezing or shortness of breath.   amphetamine-dextroamphetamine (ADDERALL XR) 10 MG 24 hr capsule Take 1 capsule (10 mg total) by mouth daily.   dicyclomine (BENTYL) 20 MG tablet Take 1 tablet (20 mg total) by mouth 3 (three) times daily before meals.   omeprazole (PRILOSEC) 20 MG capsule Take 20 mg by mouth daily.   No facility-administered encounter medications on file as of 08/15/2021.    No past medical history on file.  No past surgical history on file.  No family history on file.  Social History   Socioeconomic History   Marital status: Married    Spouse name: Not on file   Number of children: Not on file   Years of education: Not on file   Highest education level: Not on file  Occupational History   Not on file  Tobacco Use   Smoking status: Never   Smokeless tobacco: Never  Substance and Sexual Activity   Alcohol use: Not on file   Drug use: Not on file   Sexual activity: Not on file  Other Topics Concern   Not  on file  Social History Narrative   Not on file   Social Determinants of Health   Financial Resource Strain: Not on file  Food Insecurity: Not on file  Transportation Needs: Not on file  Physical Activity: Not on file  Stress: Not on file  Social Connections: Not on file  Intimate Partner Violence: Not on file    ROS Review of Systems:  A fourteen system review of systems was performed and found to be positive as per HPI.  Objective    BP 124/73   Pulse 66   Temp 97.7 F (36.5 C)   Ht 5\' 9"  (1.753 m)   Wt 177 lb (80.3 kg)   SpO2 100%   BMI 26.14 kg/m   Physical Exam General:  Well Developed, well nourished, appropriate for stated age.  Neuro:  Alert and oriented,  extra-ocular muscles intact  HEENT:  Normocephalic, atraumatic, neck supple, no carotid bruits appreciated  Skin:  no gross rash, warm, pink. Cardiac:  RRR, S1 S2 Respiratory:  CTA B/L, Not using accessory muscles, speaking in full sentences- unlabored. Vascular:  Ext warm, no cyanosis apprec.; cap RF less 2 sec. Psych:  No HI/SI, judgement and insight good, Euthymic mood. Full Affect.      Assessment & Plan:   Problem List Items Addressed This Visit       Other  ADHD (attention deficit hyperactivity disorder) - Primary   Relevant Medications   amphetamine-dextroamphetamine (ADDERALL XR) 10 MG 24 hr capsule   Other Relevant Orders   Ambulatory referral to Psychiatry   Other Visit Diagnoses     Irritable bowel syndrome with both constipation and diarrhea          ADHD: -Reviewed pediatric records in Epic. Patient was started on Concerta 18 mg [id number]. Will place referral to Psychiatry and in the interim will trial Adderall XR 10 mg. Advised to let me know if unable to tolerate medication. Will reassess medication therapy in 4 weeks.   Irritable bowel syndrome with both constipation and diarrhea: -Followed by gastroenterology. -Reviewed visit 07/24/2021. -Recommend to continue with  dicyclomine 20 mg TID.  Return in about 4 weeks (around 09/12/2021) for ADHD.   Lorrene Reid, PA-C

## 2021-09-12 ENCOUNTER — Encounter: Payer: Self-pay | Admitting: Physician Assistant

## 2021-09-12 ENCOUNTER — Ambulatory Visit: Payer: 59 | Admitting: Physician Assistant

## 2021-09-12 VITALS — BP 120/73 | HR 76 | Temp 97.7°F | Ht 69.0 in | Wt 173.0 lb

## 2021-09-12 DIAGNOSIS — F909 Attention-deficit hyperactivity disorder, unspecified type: Secondary | ICD-10-CM | POA: Diagnosis not present

## 2021-09-12 MED ORDER — AMPHETAMINE-DEXTROAMPHETAMINE 10 MG PO TABS: 10.0000 mg | ORAL_TABLET | Freq: Two times a day (BID) | ORAL | 0 refills | Status: DC

## 2021-09-12 NOTE — Progress Notes (Signed)
Established patient visit   Patient: Jackson Trevino   DOB: [birth year]   21 y.o. Male  MRN: [id number] Visit Date: 09/12/2021  Chief Complaint  Patient presents with   ADHD   Subjective    HPI  Patient presents for follow-up on ADHD. Patient was started Adderall XR 10 mg. Has an appointment with psychiatry 11/18/21. Reports did have some side effects when he started the medication. Has noticed not as thirsty when he started medication. Occasionally feels dizzy, only happened once the past week. Reports has noticed being less forgetful and not misplacing things. Able to recall more things at work. No chest pain, palpitations, mood changes or sleep disturbance.       09/12/2021    9:47 AM 08/15/2021   11:21 AM 02/18/2021    8:29 AM  Depression screen PHQ 2/9  Decreased Interest 0 0 0  Down, Depressed, Hopeless 0 0 0  PHQ - 2 Score 0 0 0  Altered sleeping 0 3   Tired, decreased energy 0 3   Change in appetite 0 3   Feeling bad or failure about yourself  0 0   Trouble concentrating 1 3   Moving slowly or fidgety/restless 0 0   Suicidal thoughts 0 0   PHQ-9 Score 1 12   Difficult doing work/chores Not difficult at all Somewhat difficult       09/12/2021    9:48 AM 08/15/2021   11:22 AM  GAD 7 : Generalized Anxiety Score  Nervous, Anxious, on Edge 0 1  Control/stop worrying 0 0  Worry too much - different things 0 0  Trouble relaxing 1 1  Restless 0 0  Easily annoyed or irritable 0 0  Afraid - awful might happen 0 0  Total GAD 7 Score 1 2  Anxiety Difficulty Not difficult at all Not difficult at all        Medications: Outpatient Medications Prior to Visit  Medication Sig   albuterol (VENTOLIN HFA) 108 (90 Base) MCG/ACT inhaler Inhale 2 puffs into the lungs every 6 (six) hours as needed for wheezing or shortness of breath.   dicyclomine (BENTYL) 20 MG tablet Take 1 tablet (20 mg total) by mouth 3 (three) times daily before meals.   omeprazole (PRILOSEC) 20 MG  capsule Take 20 mg by mouth daily.   [DISCONTINUED] amphetamine-dextroamphetamine (ADDERALL XR) 10 MG 24 hr capsule Take 1 capsule (10 mg total) by mouth daily.   No facility-administered medications prior to visit.    Review of Systems Review of Systems:  A fourteen system review of systems was performed and found to be positive as per HPI.     Objective    BP 120/73   Pulse 76   Temp 97.7 F (36.5 C)   Ht 5\' 9"  (1.753 m)   Wt 173 lb (78.5 kg)   SpO2 100%   BMI 25.55 kg/m  BP Readings from Last 3 Encounters:  09/12/21 120/73  08/15/21 124/73  07/24/21 (!) 172/79   Wt Readings from Last 3 Encounters:  09/12/21 173 lb (78.5 kg)  08/15/21 177 lb (80.3 kg)  07/24/21 179 lb (81.2 kg)    Physical Exam  General: Cooperative, in no acute distress, eye tic noted Neuro:  Alert and oriented,  extra-ocular muscles intact  HEENT:  Normocephalic, atraumatic, neck supple  Skin:  no gross rash, warm, pink. Cardiac:  RRR, S1 S2 Respiratory: CTA B/L  Vascular:  Ext warm, no cyanosis apprec.; cap RF less 2 sec. Psych:  No HI/SI, judgement and insight good, Euthymic mood. Full Affect.   No results found for any visits on 09/12/21.  Assessment & Plan      Problem List Items Addressed This Visit       Other   ADHD (attention deficit hyperactivity disorder) - Primary   Relevant Medications   amphetamine-dextroamphetamine (ADDERALL) 10 MG tablet   ADHD: -Some improvement. Patient does have a rotating work schedule including nights so will change from Adderall XR to IR. Advised to let me know if unable to tolerate medication. Will obtain ASRS to monitor progress. Follow-up with psychiatry as scheduled. Will reassess medication therapy in 4 weeks.     Return in about 4 weeks (around 10/10/2021) for ADHD.        Mayer Masker, PA-C  [facility] at [facility] [phone] (phone) [phone] (fax)  [facility]

## 2021-09-12 NOTE — Patient Instructions (Signed)
Attention Deficit Hyperactivity Disorder, Adult Attention deficit hyperactivity disorder (ADHD) is a mental health disorder that starts during childhood (neurodevelopmental disorder). For many people with ADHD, the disorder continues into the adult years. Treatment can help you manage your symptoms. What are the causes? The exact cause of ADHD is not known. Most experts believe genetics and environmental factors contribute to ADHD. What increases the risk? The following factors may make you more likely to develop this condition: Having a family history of ADHD. Being male. Being born to a mother who smoked or drank alcohol during pregnancy. Being exposed to lead or other toxins in the womb or early in life. Being born before 37 weeks of pregnancy (prematurely) or at a low birth weight. Having experienced a brain injury. What are the signs or symptoms? Symptoms of this condition depend on the type of ADHD. The two main types are inattentive and hyperactive-impulsive. Some people may have symptoms of both types. Symptoms of the inattentive type include: Difficulty paying attention. Making careless mistakes. Not following instructions. Being disorganized. Avoiding tasks that require time and attention. Losing and forgetting things. Being easily distracted. Symptoms of the hyperactive-impulsive type include: Restlessness. Talking too much. Interrupting. Difficulty with: Sitting still. Feeling motivated. Relaxing. Waiting in line or waiting for a turn. In adults, this condition may lead to certain problems, such as: Keeping jobs. Performing tasks at work. Having stable relationships. Being on time or keeping to a schedule. How is this diagnosed? This condition is diagnosed based on your current symptoms and your history of symptoms. The diagnosis can be made by a health care provider such as a primary care provider or a mental health care specialist. Your health care provider may use  a symptom checklist or a behavior rating scale to evaluate your symptoms. He or she may also want to talk with people who have observed your behaviors throughout your life. How is this treated? This condition can be treated with medicines and behavior therapy. Medicines may be the best option to reduce impulsive behaviors and improve attention. Your health care provider may recommend: Stimulant medicines. These are the most common medicines used for adult ADHD. They affect certain chemicals in the brain (neurotransmitters) and improve your ability to control your symptoms. A non-stimulant medicine for adult ADHD (atomoxetine). This medicine increases a neurotransmitter called norepinephrine. It may take weeks to months to see effects from this medicine. Counseling and behavioral management are also important for treating ADHD. Counseling is often used along with medicine. Your health care provider may suggest: Cognitive behavioral therapy (CBT). This type of therapy teaches you to replace negative thoughts and actions with positive thoughts and actions. When used as part of ADHD treatment, this therapy may also include: Coping strategies for organization, time management, impulse control, and stress reduction. Mindfulness and meditation training. Behavioral management. You may work with a coach who is specially trained to help people with ADHD manage and organize activities and function more effectively. Follow these instructions at home: Medicines  Take over-the-counter and prescription medicines only as told by your health care provider. Talk with your health care provider about the possible side effects of your medicines and how to manage them. Lifestyle  Do not use drugs. Do not drink alcohol if: Your health care provider tells you not to drink. You are pregnant, may be pregnant, or are planning to become pregnant. If you drink alcohol: Limit how much you use to: 0-1 drink a day for  women. 0-2 drinks a day   for men. Be aware of how much alcohol is in your drink. In the [place] one drink equals one 12 oz bottle of beer (355 mL), one 5 oz glass of wine (148 mL), or one 1 oz glass of hard liquor (44 mL). Get enough sleep. Eat a healthy diet. Exercise regularly. Exercise can help to reduce stress and anxiety. General instructions Learn as much as you can about adult ADHD, and work closely with your health care providers to find the treatments that work best for you. Follow the same schedule each day. Use reminder devices like notes, calendars, and phone apps to stay on time and organized. Keep all follow-up visits as told by your health care provider and therapist. This is important. Where to find more information A health care provider may be able to recommend resources that are available online or over the phone. You could start with: Attention Deficit Disorder Association (ADDA): www.add.org National Institute of Mental Health (NIMH): www.nimh.nih.gov Contact a health care provider if: Your symptoms continue to cause problems. You have side effects from your medicine, such as: Repeated muscle twitches, coughing, or speech outbursts. Sleep problems. Loss of appetite. Dizziness. Unusually fast heartbeat. Stomach pains. Headaches. You are struggling with anxiety, depression, or substance abuse. Get help right away if you: Have a severe reaction to a medicine. If you ever feel like you may hurt yourself or others, or have thoughts about taking your own life, get help right away. You can go to the nearest emergency department or call: Your local emergency services (911 in the [place] A suicide crisis helpline, such as the National Suicide Prevention Lifeline at 1-800-273-8255 or 988 in the [place] This is open 24 hours a day. Summary ADHD is a mental health disorder that starts during childhood (neurodevelopmental disorder) and often continues into the adult years. The  exact cause of ADHD is not known. Most experts believe genetics and environmental factors contribute to ADHD. There is no cure for ADHD, but treatment with medicine, cognitive behavioral therapy, or behavioral management can help you manage your condition. This information is not intended to replace advice given to you by your health care provider. Make sure you discuss any questions you have with your health care provider. Document Revised: 10/10/2020 Document Reviewed: 08/09/2018 Elsevier Patient Education  2023 Elsevier Inc.  

## 2021-10-11 ENCOUNTER — Ambulatory Visit: Payer: 59 | Admitting: Physician Assistant

## 2021-10-28 ENCOUNTER — Ambulatory Visit: Payer: 59 | Admitting: Physician Assistant

## 2021-10-28 ENCOUNTER — Encounter: Payer: Self-pay | Admitting: Physician Assistant

## 2021-10-28 VITALS — BP 109/68 | HR 66 | Temp 97.7°F | Ht 69.0 in | Wt 170.0 lb

## 2021-10-28 DIAGNOSIS — F909 Attention-deficit hyperactivity disorder, unspecified type: Secondary | ICD-10-CM

## 2021-10-28 MED ORDER — AMPHETAMINE-DEXTROAMPHETAMINE 15 MG PO TABS: 15.0000 mg | ORAL_TABLET | Freq: Two times a day (BID) | ORAL | 0 refills | Status: DC

## 2021-10-28 NOTE — Progress Notes (Signed)
Established patient visit   Patient: Jackson Trevino   DOB: 01-09-2001   21 y.o. Male  MRN: [id number] Visit Date: 10/28/2021  Chief Complaint  Patient presents with   ADHD   Subjective    HPI  Patient presents for follow-up on medication management for ADHD. At last visit we changed from Adderall XR to IR. Patient reports getting more distracted. No having as much of the side effects with IR as with the XR. States has a newborn so adjusting to new house environment and today is his first day going back to work. No chest pain, palpitations, or sleep disturbance. Has an appointment with psychiatry August 21st. States has noticed being more sad when family that is visiting leaves and less motivation. No SI/HI.       10/28/2021   10:47 AM 09/12/2021    9:47 AM 08/15/2021   11:21 AM 02/18/2021    8:29 AM  Depression screen PHQ 2/9  Decreased Interest 1 0 0 0  Down, Depressed, Hopeless 1 0 0 0  PHQ - 2 Score 2 0 0 0  Altered sleeping 0 0 3   Tired, decreased energy 0 0 3   Change in appetite 0 0 3   Feeling bad or failure about yourself  0 0 0   Trouble concentrating _0 Moving slowly or fidgety/restless 0 0 0   Suicidal thoughts 0 0 0   PHQ-9 Score _1 Difficult doing work/chores Somewhat difficult Not difficult at all Somewhat difficult       10/28/2021   10:48 AM 09/12/2021    9:48 AM 08/15/2021   11:22 AM  GAD 7 : Generalized Anxiety Score  Nervous, Anxious, on Edge 0 0 1  Control/stop worrying 0 0 0  Worry too much - different things 0 0 0  Trouble relaxing 0 1 1  Restless 0 0 0  Easily annoyed or irritable 0 0 0  Afraid - awful might happen 0 0 0  Total GAD 7 Score 0 1 2  Anxiety Difficulty Not difficult at all Not difficult at all Not difficult at all       Medications: Outpatient Medications Prior to Visit  Medication Sig   albuterol (VENTOLIN HFA) 108 (90 Base) MCG/ACT inhaler Inhale 2 puffs into the lungs every 6 (six) hours as needed for  wheezing or shortness of breath.   dicyclomine (BENTYL) 20 MG tablet Take 1 tablet (20 mg total) by mouth 3 (three) times daily before meals.   omeprazole (PRILOSEC) 20 MG capsule Take 20 mg by mouth daily.   [DISCONTINUED] amphetamine-dextroamphetamine (ADDERALL) 10 MG tablet Take 1 tablet (10 mg total) by mouth 2 (two) times daily.   No facility-administered medications prior to visit.    Review of Systems Review of Systems:  A fourteen system review of systems was performed and found to be positive as per HPI.   Last CBC No results found for: "WBC", "HGB", "HCT", "MCV", "MCH", "RDW", "PLT" Last metabolic panel No results found for: "GLUCOSE", "NA", "K", "CL", "CO2", "BUN", "CREATININE", "EGFR", "CALCIUM", "PHOS", "PROT", "ALBUMIN", "LABGLOB", "AGRATIO", "BILITOT", "ALKPHOS", "AST", "ALT", "ANIONGAP" Last lipids No results found for: "CHOL", "HDL", "LDLCALC", "LDLDIRECT", "TRIG", "CHOLHDL" Last hemoglobin A1c No results found for: "HGBA1C" Last thyroid functions No results found for: "TSH", "T3TOTAL", "T4TOTAL", "THYROIDAB" Last vitamin D No results found for: "25OHVITD2", "25OHVITD3", "VD25OH"   Objective    BP 109/68   Pulse 66   Temp 97.7 F (  36.5 C)   Ht _0  (1.753 m)   Wt 170 lb (77.1 kg)   SpO2 100%   BMI 25.10 kg/m  BP Readings from Last 3 Encounters:  10/28/21 109/68  09/12/21 120/73  08/15/21 124/73   Wt Readings from Last 3 Encounters:  10/28/21 170 lb (77.1 kg)  09/12/21 173 lb (78.5 kg)  08/15/21 177 lb (80.3 kg)    Physical Exam  General:  Cooperative, in no acute distress, appropriate for stated age.  Neuro:  Alert and oriented,  extra-ocular muscles intact  HEENT:  Normocephalic, atraumatic, neck supple  Skin:  no gross rash, warm, pink. Cardiac:  RRR, S1 S2 Respiratory: CTA B/L  Vascular:  Ext warm, no cyanosis apprec.; cap RF less 2 sec. Psych:  No HI/SI, judgement and insight good, Euthymic mood. Full Affect.   No results found for any  visits on 10/28/21.  Assessment & Plan      Problem List Items Addressed This Visit       Other   ADHD (attention deficit hyperactivity disorder) - Primary   Relevant Medications   amphetamine-dextroamphetamine (ADDERALL) 15 MG tablet   ADHD: -Unchanged, still struggling with concentration. Will increase Adderall 15 mg BID. Discussed if depressive symptomology worsens with increased dose of medication then could be medication side effect and recommend treatment adjustments by changing medication. Pt verbalized understanding. Also discussed new home environment could be contributing to mood. Follow-up with psychiatry as scheduled. PDMP reviewed, no aberrancies noted.   Return in about 3 months (around 01/28/2022) for ADHD.        Lorrene Reid, PA-C  [facility] [phone] (phone) [phone] (fax)  [place]

## 2021-10-28 NOTE — Patient Instructions (Signed)
Attention Deficit Hyperactivity Disorder, Adult Attention deficit hyperactivity disorder (ADHD) is a mental health disorder that starts during childhood (neurodevelopmental disorder). For many people with ADHD, the disorder continues into the adult years. Treatment can help you manage your symptoms. What are the causes? The exact cause of ADHD is not known. Most experts believe genetics and environmental factors contribute to ADHD. What increases the risk? The following factors may make you more likely to develop this condition: Having a family history of ADHD. Being male. Being born to a mother who smoked or drank alcohol during pregnancy. Being exposed to lead or other toxins in the womb or early in life. Being born before 37 weeks of pregnancy (prematurely) or at a low birth weight. Having experienced a brain injury. What are the signs or symptoms? Symptoms of this condition depend on the type of ADHD. The two main types are inattentive and hyperactive-impulsive. Some people may have symptoms of both types. Symptoms of the inattentive type include: Difficulty paying attention. Making careless mistakes. Not following instructions. Being disorganized. Avoiding tasks that require time and attention. Losing and forgetting things. Being easily distracted. Symptoms of the hyperactive-impulsive type include: Restlessness. Talking too much. Interrupting. Difficulty with: Sitting still. Feeling motivated. Relaxing. Waiting in line or waiting for a turn. In adults, this condition may lead to certain problems, such as: Keeping jobs. Performing tasks at work. Having stable relationships. Being on time or keeping to a schedule. How is this diagnosed? This condition is diagnosed based on your current symptoms and your history of symptoms. The diagnosis can be made by a health care provider such as a primary care provider or a mental health care specialist. Your health care provider may use  a symptom checklist or a behavior rating scale to evaluate your symptoms. He or she may also want to talk with people who have observed your behaviors throughout your life. How is this treated? This condition can be treated with medicines and behavior therapy. Medicines may be the best option to reduce impulsive behaviors and improve attention. Your health care provider may recommend: Stimulant medicines. These are the most common medicines used for adult ADHD. They affect certain chemicals in the brain (neurotransmitters) and improve your ability to control your symptoms. A non-stimulant medicine for adult ADHD (atomoxetine). This medicine increases a neurotransmitter called norepinephrine. It may take weeks to months to see effects from this medicine. Counseling and behavioral management are also important for treating ADHD. Counseling is often used along with medicine. Your health care provider may suggest: Cognitive behavioral therapy (CBT). This type of therapy teaches you to replace negative thoughts and actions with positive thoughts and actions. When used as part of ADHD treatment, this therapy may also include: Coping strategies for organization, time management, impulse control, and stress reduction. Mindfulness and meditation training. Behavioral management. You may work with a coach who is specially trained to help people with ADHD manage and organize activities and function more effectively. Follow these instructions at home: Medicines  Take over-the-counter and prescription medicines only as told by your health care provider. Talk with your health care provider about the possible side effects of your medicines and how to manage them. Lifestyle  Do not use drugs. Do not drink alcohol if: Your health care provider tells you not to drink. You are pregnant, may be pregnant, or are planning to become pregnant. If you drink alcohol: Limit how much you use to: 0-1 drink a day for  women. 0-2 drinks a day   for men. Be aware of how much alcohol is in your drink. In the [place] one drink equals one 12 oz bottle of beer (355 mL), one 5 oz glass of wine (148 mL), or one 1 oz glass of hard liquor (44 mL). Get enough sleep. Eat a healthy diet. Exercise regularly. Exercise can help to reduce stress and anxiety. General instructions Learn as much as you can about adult ADHD, and work closely with your health care providers to find the treatments that work best for you. Follow the same schedule each day. Use reminder devices like notes, calendars, and phone apps to stay on time and organized. Keep all follow-up visits as told by your health care provider and therapist. This is important. Where to find more information A health care provider may be able to recommend resources that are available online or over the phone. You could start with: Attention Deficit Disorder Association (ADDA): www.add.org National Institute of Mental Health (NIMH): www.nimh.nih.gov Contact a health care provider if: Your symptoms continue to cause problems. You have side effects from your medicine, such as: Repeated muscle twitches, coughing, or speech outbursts. Sleep problems. Loss of appetite. Dizziness. Unusually fast heartbeat. Stomach pains. Headaches. You are struggling with anxiety, depression, or substance abuse. Get help right away if you: Have a severe reaction to a medicine. If you ever feel like you may hurt yourself or others, or have thoughts about taking your own life, get help right away. You can go to the nearest emergency department or call: Your local emergency services (911 in the [place] A suicide crisis helpline, such as the National Suicide Prevention Lifeline at 1-800-273-8255 or 988 in the [place] This is open 24 hours a day. Summary ADHD is a mental health disorder that starts during childhood (neurodevelopmental disorder) and often continues into the adult years. The  exact cause of ADHD is not known. Most experts believe genetics and environmental factors contribute to ADHD. There is no cure for ADHD, but treatment with medicine, cognitive behavioral therapy, or behavioral management can help you manage your condition. This information is not intended to replace advice given to you by your health care provider. Make sure you discuss any questions you have with your health care provider. Document Revised: 10/10/2020 Document Reviewed: 08/09/2018 Elsevier Patient Education  2023 Elsevier Inc.  

## 2021-10-29 ENCOUNTER — Ambulatory Visit: Payer: 59 | Admitting: Physician Assistant

## 2021-11-04 ENCOUNTER — Telehealth: Payer: 59 | Admitting: Physician Assistant

## 2021-11-04 DIAGNOSIS — K580 Irritable bowel syndrome with diarrhea: Secondary | ICD-10-CM

## 2021-11-04 MED ORDER — LOPERAMIDE HCL 2 MG PO CAPS: 4.0000 mg | ORAL_CAPSULE | ORAL | 0 refills | Status: AC | PRN

## 2021-11-04 NOTE — Patient Instructions (Signed)
[name], thank you for joining Margaretann Loveless, PA-C for today's virtual visit.  While this provider is not your primary care provider (PCP), if your PCP is located in our provider database this encounter information will be shared with them immediately following your visit.  Consent: (Patient) Jackson Trevino provided verbal consent for this virtual visit at the beginning of the encounter.  Current Medications:  Current Outpatient Medications:    loperamide (IMODIUM) 2 MG capsule, Take 2 capsules (4 mg total) by mouth as needed for diarrhea or loose stools., Disp: 30 capsule, Rfl: 0   albuterol (VENTOLIN HFA) 108 (90 Base) MCG/ACT inhaler, Inhale 2 puffs into the lungs every 6 (six) hours as needed for wheezing or shortness of breath., Disp: 8 g, Rfl: 0   amphetamine-dextroamphetamine (ADDERALL) 15 MG tablet, Take 1 tablet by mouth 2 (two) times daily., Disp: 60 tablet, Rfl: 0   dicyclomine (BENTYL) 20 MG tablet, Take 1 tablet (20 mg total) by mouth 3 (three) times daily before meals., Disp: 90 tablet, Rfl: 2   omeprazole (PRILOSEC) 20 MG capsule, Take 20 mg by mouth daily., Disp: , Rfl:    Medications ordered in this encounter:  Meds ordered this encounter  Medications   loperamide (IMODIUM) 2 MG capsule    Sig: Take 2 capsules (4 mg total) by mouth as needed for diarrhea or loose stools.    Dispense:  30 capsule    Refill:  0    Order Specific Question:   Supervising Provider    Answer:   Hyacinth Meeker, BRIAN [[id number]]     *If you need refills on other medications prior to your next appointment, please contact your pharmacy*  Follow-Up: Call back or seek an in-person evaluation if the symptoms worsen or if the condition fails to improve as anticipated.  Other Instructions Irritable Bowel Syndrome, Adult  Irritable bowel syndrome (IBS) is a group of symptoms that affects the organs responsible for digestion (gastrointestinal tract, or GI tract). IBS is not one  specific disease. To regulate how the GI tract works, the body sends signals back and forth between the intestines and the brain. If you have IBS, there may be a problem with these signals. As a result, the GI tract does not function normally. The intestines may become more sensitive and overreact to certain things. This may be especially true when you eat certain foods or when you are under stress. There are four main types of IBS. These may be determined based on the consistency of your stool (feces): IBS with mostly (predominance of) diarrhea. IBS with predominance of constipation. IBS with mixed bowel habits. This includes both diarrhea and constipation. IBS unclassified. This includes IBS that cannot be categorized into one of the other three main types. It is important to know which type of IBS you have. Certain treatments are more likely to be helpful for certain types of IBS. What are the causes? The exact cause of IBS is not known. What increases the risk? You may have a higher risk for IBS if you: Are male. Are younger than 40 years. Have a family history of IBS. Have a mental health condition, such as depression, anxiety, or post-traumatic stress disorder. Have had a bacterial infection of your GI tract. What are the signs or symptoms? Symptoms of IBS vary from person to person. The main symptom is abdominal pain or discomfort. Other symptoms usually include one or more of the following: Diarrhea, constipation, or both. Swelling or bloating in  the abdomen. Feeling full after eating a small or regular-sized meal. Frequent gas. Mucus in the stool. A feeling of having more stool left after a bowel movement. Symptoms tend to come and go. They may be triggered by stress, mental health conditions, or certain foods. How is this diagnosed? This condition may be diagnosed based on a physical exam, your medical history, and your symptoms. You may have tests, such as: Blood  tests. Stool test. Colonoscopy. This is a procedure in which your GI tract is viewed with a long, thin, flexible tube. How is this treated? There is no cure for IBS, but treatment can help relieve symptoms. Treatment depends on the type of IBS you have, and may include: Changes to your diet, such as: Avoiding foods that cause symptoms. Drinking more water. Following a low-FODMAP (fermentable oligosaccharides, disaccharides, monosaccharides, and polyols) diet for up to 6 weeks, or as told by your health care provider. FODMAPs are sugars that are hard for some people to digest. Eating more fiber. Eating small meals at the same times every day. Medicines. These may include: Fiber supplements, if you have constipation. Medicine to control diarrhea (antidiarrheal medicines). Medicine to help control muscle tightening (spasms) in your GI tract (antispasmodic medicines). Medicines to help with mental health conditions, such as antidepressants. Talk therapy or counseling. Working with a dietitian to help create a food plan that is right for you. Managing your stress. Follow these instructions at home: Eating and drinking  Eat a healthy diet. Eat 5-6 small meals a day. Try to eat meals at about the same times each day. Do not eat large meals. Gradually eat more fiber-rich foods. These include whole grains, fruits, and vegetables. This may be especially helpful if you have IBS with constipation. Eat a diet low in FODMAPs. You may need to avoid foods such as citrus fruits, cabbage, garlic, and onions. Drink enough fluid to keep your urine pale yellow. Keep a journal of foods that seem to trigger symptoms. Avoid foods and drinks that: Contain added sugar. Make your symptoms worse. These may include dairy products, caffeinated drinks, and carbonated drinks. Alcohol use Do not drink alcohol if: Your health care provider tells you not to drink. You are pregnant, may be pregnant, or are planning to  become pregnant. If you drink alcohol: Limit how much you have to: 0-1 drink a day for women. 0-2 drinks a day for men. Know how much alcohol is in your drink. In the [place] one drink equals one 12 oz bottle of beer (355 mL), one 5 oz glass of wine (148 mL), or one 1 oz glass of hard liquor (44 mL) General instructions Take over-the-counter and prescription medicines only as told by your health care provider. This includes supplements. Get enough exercise. Do at least 150 minutes of moderate-intensity exercise each week. Manage your stress. Getting enough sleep and exercise can help you manage stress. Keep all follow-up visits. This is important. This includes all visits with your health care provider and therapist. Where to find more information International Foundation for Functional Gastrointestinal Disorders: aboutibs.[employer] of Diabetes and Digestive and Kidney Diseases: StageSync.si Contact a health care provider if: You have constant pain. You lose weight. You have diarrhea that gets worse. You have bleeding from the rectum. You vomit often. You have a fever. Get help right away if: You have severe abdominal pain. You have diarrhea with symptoms of dehydration, such as dizziness or dry mouth. You have bloody or black stools. You  have severe abdominal bloating. You have vomiting that does not stop. You have blood in your vomit. Summary Irritable bowel syndrome (IBS) is not one specific disease. It is a group of symptoms that affects digestion. Your intestines may become more sensitive and overreact to certain things. This may be especially true when you eat certain foods or when you are under stress. There is no cure for IBS, but treatment can help relieve symptoms. This information is not intended to replace advice given to you by your health care provider. Make sure you discuss any questions you have with your health care provider. Document Revised: 02/27/2021  Document Reviewed: 02/27/2021 Elsevier Patient Education  2023 Elsevier Inc.    If you have been instructed to have an in-person evaluation today at a local Urgent Care facility, please use the link below. It will take you to a list of all of our available [facility], including address, phone number and hours of operation. Please do not delay care.  [facility]  If you or a family member do not have a primary care provider, use the link below to schedule a visit and establish care. When you choose a [facility] primary care physician or advanced practice provider, you gain a long-term partner in health. Find a Primary Care Provider  Learn more about [facility]'s in-office and virtual care options: [facility] - Get Care Now

## 2021-11-04 NOTE — Progress Notes (Signed)
Virtual Visit Consent   Jackson Trevino, you are scheduled for a virtual visit with a [facility] provider today. Just as with appointments in the office, your consent must be obtained to participate. Your consent will be active for this visit and any virtual visit you may have with one of our providers in the next 365 days. If you have a MyChart account, a copy of this consent can be sent to you electronically.  As this is a virtual visit, video technology does not allow for your provider to perform a traditional examination. This may limit your provider's ability to fully assess your condition. If your provider identifies any concerns that need to be evaluated in person or the need to arrange testing (such as labs, EKG, etc.), we will make arrangements to do so. Although advances in technology are sophisticated, we cannot ensure that it will always work on either your end or our end. If the connection with a video visit is poor, the visit may have to be switched to a telephone visit. With either a video or telephone visit, we are not always able to ensure that we have a secure connection.  By engaging in this virtual visit, you consent to the provision of healthcare and authorize for your insurance to be billed (if applicable) for the services provided during this visit. Depending on your insurance coverage, you may receive a charge related to this service.  I need to obtain your verbal consent now. Are you willing to proceed with your visit today? Jackson Trevino has provided verbal consent on 11/04/2021 for a virtual visit (video or telephone). Margaretann Loveless, PA-C  Date: 11/04/2021 9:20 AM  Virtual Visit via Video Note   I, Margaretann Loveless, connected with  Jackson Trevino  ([id number], 2001-01-19) on 11/04/21 at  9:15 AM EDT by a video-enabled telemedicine application and verified that I am speaking with the correct person using two identifiers.  Location: Patient:  Virtual Visit Location Patient: Home Provider: Virtual Visit Location Provider: Home Office   I discussed the limitations of evaluation and management by telemedicine and the availability of in person appointments. The patient expressed understanding and agreed to proceed.    History of Present Illness: Jackson Trevino is a 21 y.o. who identifies as a male who was assigned male at birth, and is being seen today for IBS. Has been seen by GI, Dr. Servando Snare for these issues, last seen 06/2021. Had been started on Bentyl 4 times daily. Reports he had to stop Bentyl due to increasing diarrhea. Over the last week he has had increased diarrhea and larger BM as well. He has been trying to drink water, but still feels a little dehydrated. Having some mild tachycardia, reports HR goes from 60s to 120s. Has mild lightheadedness. Called GI, but unable to be seen until December 2023.    Problems:  Patient Active Problem List   Diagnosis Date Noted   Bilateral hearing loss 06/10/2021   Asperger's disorder 09/06/2020   Chronic depression 09/06/2020   Connective tissue disorder (HCC) 09/06/2020   Abnormal colonoscopy 02/28/2019   Elevated blood pressure reading 02/28/2019   Gastroesophageal reflux disease with esophagitis without hemorrhage 02/28/2019   S/P rhinoplasty 12/08/2017   Chigger bites 10/18/2012   Body mass index, pediatric, 5th percentile to less than 85th percentile for age 70/27/2014   Constipation 03/13/2011   History of gastroesophageal reflux (GERD) 03/13/2011   History of rectal bleeding 03/13/2011   ADHD (attention deficit  hyperactivity disorder) 07/10/2009   Childhood tic disorder 05/04/2008   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/04/2008   Seasonal allergic rhinitis due to pollen 02/09/2008    Allergies:  Allergies  Allergen Reactions   Azithromycin Hives and Itching   Medications:  Current Outpatient Medications:    loperamide (IMODIUM) 2 MG capsule, Take 2  capsules (4 mg total) by mouth as needed for diarrhea or loose stools., Disp: 30 capsule, Rfl: 0   albuterol (VENTOLIN HFA) 108 (90 Base) MCG/ACT inhaler, Inhale 2 puffs into the lungs every 6 (six) hours as needed for wheezing or shortness of breath., Disp: 8 g, Rfl: 0   amphetamine-dextroamphetamine (ADDERALL) 15 MG tablet, Take 1 tablet by mouth 2 (two) times daily., Disp: 60 tablet, Rfl: 0   dicyclomine (BENTYL) 20 MG tablet, Take 1 tablet (20 mg total) by mouth 3 (three) times daily before meals., Disp: 90 tablet, Rfl: 2   omeprazole (PRILOSEC) 20 MG capsule, Take 20 mg by mouth daily., Disp: , Rfl:   Observations/Objective: Patient is well-developed, well-nourished in no acute distress.  Resting comfortably at home.  Head is normocephalic, atraumatic.  No labored breathing.  Speech is clear and coherent with logical content.  Patient is alert and oriented at baseline.  Walking around his house without issue  Assessment and Plan: 1. Irritable bowel syndrome with diarrhea - loperamide (IMODIUM) 2 MG capsule; Take 2 capsules (4 mg total) by mouth as needed for diarrhea or loose stools.  Dispense: 30 capsule; Refill: 0  - Imodium prescribed for diarrhea - Advised to push fluids, including electrolyte beverages - Advised to follow up with PCP for further medication management (discussed amitriptyline/nortriptyline, lomotil, viberzi) - Seek in person evaluation if symptoms progress or worsen  Follow Up Instructions: I discussed the assessment and treatment plan with the patient. The patient was provided an opportunity to ask questions and all were answered. The patient agreed with the plan and demonstrated an understanding of the instructions.  A copy of instructions were sent to the patient via MyChart unless otherwise noted below.   The patient was advised to call back or seek an in-person evaluation if the symptoms worsen or if the condition fails to improve as anticipated.  Time:  I  spent 12 minutes with the patient via telehealth technology discussing the above problems/concerns.    Margaretann Loveless, PA-C

## 2021-11-18 ENCOUNTER — Encounter (HOSPITAL_COMMUNITY): Payer: Self-pay | Admitting: Psychiatry

## 2021-11-18 ENCOUNTER — Ambulatory Visit (HOSPITAL_BASED_OUTPATIENT_CLINIC_OR_DEPARTMENT_OTHER): Payer: 59 | Admitting: Psychiatry

## 2021-11-18 VITALS — Wt 164.0 lb

## 2021-11-18 DIAGNOSIS — F840 Autistic disorder: Secondary | ICD-10-CM | POA: Diagnosis not present

## 2021-11-18 DIAGNOSIS — F845 Asperger's syndrome: Secondary | ICD-10-CM

## 2021-11-18 DIAGNOSIS — F902 Attention-deficit hyperactivity disorder, combined type: Secondary | ICD-10-CM | POA: Diagnosis not present

## 2021-11-18 MED ORDER — LISDEXAMFETAMINE DIMESYLATE 30 MG PO CAPS: 30.0000 mg | ORAL_CAPSULE | Freq: Every day | ORAL | 0 refills | Status: AC
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
## 2021-12-09 ENCOUNTER — Telehealth (HOSPITAL_COMMUNITY): Payer: Self-pay | Admitting: Psychiatry

## 2021-12-17 ENCOUNTER — Ambulatory Visit: Payer: 59 | Admitting: Emergency Medicine

## 2021-12-17 ENCOUNTER — Encounter: Payer: Self-pay | Admitting: Emergency Medicine

## 2021-12-17 ENCOUNTER — Ambulatory Visit (INDEPENDENT_AMBULATORY_CARE_PROVIDER_SITE_OTHER): Payer: 59

## 2021-12-17 VITALS — BP 156/88 | HR 112 | Temp 97.7°F | Ht 69.0 in | Wt 171.4 lb

## 2021-12-17 DIAGNOSIS — I480 Paroxysmal atrial fibrillation: Secondary | ICD-10-CM | POA: Insufficient documentation

## 2021-12-17 DIAGNOSIS — R000 Tachycardia, unspecified: Secondary | ICD-10-CM | POA: Insufficient documentation

## 2021-12-17 DIAGNOSIS — T50905A Adverse effect of unspecified drugs, medicaments and biological substances, initial encounter: Secondary | ICD-10-CM

## 2021-12-17 HISTORY — DX: Adverse effect of unspecified drugs, medicaments and biological substances, initial encounter: T50.905A

## 2021-12-17 HISTORY — DX: Paroxysmal atrial fibrillation: I48.0

## 2021-12-17 LAB — COMPREHENSIVE METABOLIC PANEL
ALT: 10 U/L (ref 0–53)
AST: 12 U/L (ref 0–37)
Albumin: 4.6 g/dL (ref 3.5–5.2)
Alkaline Phosphatase: 62 U/L (ref 39–117)
BUN: 12 mg/dL (ref 6–23)
CO2: 29 mEq/L (ref 19–32)
Calcium: 9.8 mg/dL (ref 8.4–10.5)
Chloride: 103 mEq/L (ref 96–112)
Creatinine, Ser: 1.01 mg/dL (ref 0.40–1.50)
GFR: 106.95 mL/min (ref >60.00)
Glucose, Bld: 94 mg/dL (ref 70–99)
Potassium: 3.7 mEq/L (ref 3.5–5.1)
Sodium: 140 mEq/L (ref 135–145)
Total Bilirubin: 0.8 mg/dL (ref 0.2–1.2)
Total Protein: 7.4 g/dL (ref 6.0–8.3)

## 2021-12-17 LAB — CBC WITH DIFFERENTIAL/PLATELET
Basophils Absolute: 0.1 10*3/uL (ref 0.0–0.1)
Basophils Relative: 1 % (ref 0.0–3.0)
Eosinophils Absolute: 0.2 10*3/uL (ref 0.0–0.7)
Eosinophils Relative: 3.8 % (ref 0.0–5.0)
HCT: 45.8 % (ref 39.0–52.0)
Hemoglobin: 15.2 g/dL (ref 13.0–17.0)
Lymphocytes Relative: 39.6 % (ref 12.0–46.0)
Lymphs Abs: 2.6 10*3/uL (ref 0.7–4.0)
MCHC: 33.2 g/dL (ref 30.0–36.0)
MCV: 81.6 fl (ref 78.0–100.0)
Monocytes Absolute: 0.5 10*3/uL (ref 0.1–1.0)
Monocytes Relative: 7.9 % (ref 3.0–12.0)
Neutro Abs: 3.1 10*3/uL (ref 1.4–7.7)
Neutrophils Relative %: 47.7 % (ref 43.0–77.0)
Platelets: 242 10*3/uL (ref 150.0–400.0)
RBC: 5.61 Mil/uL (ref 4.22–5.81)
RDW: 13.2 % (ref 11.5–14.6)
WBC: 6.5 10*3/uL (ref 4.5–10.5)

## 2021-12-17 LAB — HEMOGLOBIN A1C: Hgb A1c MFr Bld: 5.4 % (ref 4.6–6.5)

## 2021-12-17 MED ORDER — METOPROLOL SUCCINATE ER 25 MG PO TB24: 25.0000 mg | ORAL_TABLET | Freq: Every day | ORAL | 3 refills | Status: AC

## 2021-12-17 NOTE — Patient Instructions (Signed)
Atrial Fibrillation  Atrial fibrillation is a type of heartbeat that is irregular or fast. If you have this condition, your heart beats without any order. This makes it hard for your heart to pump blood in a normal way. Atrial fibrillation may come and go, or it may become a long-lasting problem. If this condition is not treated, it can put you at higher risk for stroke, heart failure, and other heart problems. What are the causes? This condition may be caused by diseases that damage the heart. They include: High blood pressure. Heart failure. Heart valve disease. Heart surgery. Other causes include: Diabetes. Thyroid disease. Being overweight. Kidney disease. Sometimes the cause is not known. What increases the risk? You are more likely to develop this condition if: You are older. You smoke. You exercise often and very hard. You have a family history of this condition. You are a man. You use drugs. You drink a lot of alcohol. You have lung conditions, such as emphysema, pneumonia, or COPD. You have sleep apnea. What are the signs or symptoms? Common symptoms of this condition include: A feeling that your heart is beating very fast. Chest pain or discomfort. Feeling short of breath. Suddenly feeling light-headed or weak. Getting tired easily during activity. Fainting. Sweating. In some cases, there are no symptoms. How is this treated? Treatment for this condition depends on underlying conditions and how you feel when you have atrial fibrillation. They include: Medicines to: Prevent blood clots. Treat heart rate or heart rhythm problems. Using devices, such as a pacemaker, to correct heart rhythm problems. Doing surgery to remove the part of the heart that sends bad signals. Closing an area where clots can form in the heart (left atrial appendage). In some cases, your doctor will treat other underlying conditions. Follow these instructions at home: Medicines Take  over-the-counter and prescription medicines only as told by your doctor. Do not take any new medicines without first talking to your doctor. If you are taking blood thinners: Talk with your doctor before you take any medicines that have aspirin or NSAIDs, such as ibuprofen, in them. Take your medicine exactly as told by your doctor. Take it at the same time each day. Avoid activities that could hurt or bruise you. Follow instructions about how to prevent falls. Wear a bracelet that says you are taking blood thinners. Or, carry a card that lists what medicines you take. Lifestyle     Do not use any products that have nicotine or tobacco in them. These include cigarettes, e-cigarettes, and chewing tobacco. If you need help quitting, ask your doctor. Eat heart-healthy foods. Talk with your doctor about the right eating plan for you. Exercise regularly as told by your doctor. Do not drink alcohol. Lose weight if you are overweight. Do not use drugs, including cannabis. General instructions If you have a condition that causes breathing to stop for a short period of time (apnea), treat it as told by your doctor. Keep a healthy weight. Do not use diet pills unless your doctor says they are safe for you. Diet pills may make heart problems worse. Keep all follow-up visits as told by your doctor. This is important. Contact a doctor if: You notice a change in the speed, rhythm, or strength of your heartbeat. You are taking a blood-thinning medicine and you get more bruising. You get tired more easily when you move or exercise. You have a sudden change in weight. Get help right away if:  You have pain in   your chest or your belly (abdomen). You have trouble breathing. You have side effects of blood thinners, such as blood in your vomit, poop (stool), or pee (urine), or bleeding that cannot stop. You have any signs of a stroke. "BE FAST" is an easy way to remember the main warning signs: B -  Balance. Signs are dizziness, sudden trouble walking, or loss of balance. E - Eyes. Signs are trouble seeing or a change in how you see. F - Face. Signs are sudden weakness or loss of feeling in the face, or the face or eyelid drooping on one side. A - Arms. Signs are weakness or loss of feeling in an arm. This happens suddenly and usually on one side of the body. S - Speech. Signs are sudden trouble speaking, slurred speech, or trouble understanding what people say. T - Time. Time to call emergency services. Write down what time symptoms started. You have other signs of a stroke, such as: A sudden, very bad headache with no known cause. Feeling like you may vomit (nausea). Vomiting. A seizure. These symptoms may be an emergency. Do not wait to see if the symptoms will go away. Get medical help right away. Call your local emergency services (911 in the [place] Do not drive yourself to the hospital. Summary Atrial fibrillation is a type of heartbeat that is irregular or fast. You are at higher risk of this condition if you smoke, are older, have diabetes, or are overweight. Follow your doctor's instructions about medicines, diet, exercise, and follow-up visits. Get help right away if you have signs or symptoms of a stroke. Get help right away if you cannot catch your breath, or you have chest pain or discomfort. This information is not intended to replace advice given to you by your health care provider. Make sure you discuss any questions you have with your health care provider. Document Revised: 09/08/2018 Document Reviewed: 09/08/2018 Elsevier Patient Education  2023 [employer]  

## 2021-12-17 NOTE — Assessment & Plan Note (Signed)
Most likely atrial fibrillation. May benefit from beta-blocker treatment with metoprolol succinate 25 mg daily. Needs cardiology evaluation.  Referral placed today.

## 2021-12-17 NOTE — Progress Notes (Signed)
[place] 21 y.o.   Chief Complaint  Patient presents with   concerns about blood pressure    Elevated BP, elevated heart rate. Concern about Afib ,  Patient stated Vyvanse, took that medication 1 hr before episode    HISTORY OF PRESENT ILLNESS: This is a 21 y.o. male complaining of episode of elevated blood pressure with irregular tachycardia. Recently started on Vyvanse. Patient is an EMT.  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  Started feeling lightheaded and dizzy. Recorded irregular tachycardia between 140 and 160s with elevated blood pressure  readings.  Whole episode lasted about 15 to 20 minutes.  Recordings look like atrial fibrillation with fast ventricular response. Asymptomatic today.  HPI   Prior to Admission medications   Medication Sig Start Date End Date Taking? Authorizing Provider  lisdexamfetamine (VYVANSE) 30 MG capsule Take 1 capsule (30 mg total) by mouth daily. 11/18/21  Yes Arfeen, Arlyce Harman, MD  loperamide (IMODIUM) 2 MG capsule Take 2 capsules (4 mg total) by mouth as needed for diarrhea or loose stools. 11/04/21  Yes Mar Daring, PA-C  omeprazole (PRILOSEC) 20 MG capsule Take 20 mg by mouth daily.   Yes [provider]    Allergies  Allergen Reactions   Azithromycin Hives and Itching    Patient Active Problem List   Diagnosis Date Noted   Bilateral hearing loss 06/10/2021   Asperger's disorder 09/06/2020   Chronic depression 09/06/2020   Connective tissue disorder ([place]) 09/06/2020   Abnormal colonoscopy 02/28/2019   Gastroesophageal reflux disease with esophagitis without hemorrhage 02/28/2019   S/P rhinoplasty 12/08/2017   History of gastroesophageal reflux (GERD) 03/13/2011   ADHD (attention deficit hyperactivity disorder) 07/10/2009   Childhood tic disorder 05/04/2008   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/04/2008   Seasonal allergic rhinitis due to  pollen 02/09/2008    No past medical history on file.  No past surgical history on file.  Social History   Socioeconomic History   Marital status: Married    Spouse name: Not on file   Number of children: Not on file   Years of education: Not on file   Highest education level: Not on file  Occupational History   Not on file  Tobacco Use   Smoking status: Never   Smokeless tobacco: Never  Substance and Sexual Activity   Alcohol use: Not on file   Drug use: Not on file   Sexual activity: Not on file  Other Topics Concern   Not on file  Social History Narrative   Not on file   Social Determinants of Health   Financial Resource Strain: Not on file  Food Insecurity: Not on file  Transportation Needs: Not on file  Physical Activity: Not on file  Stress: Not on file  Social Connections: Not on file  Intimate Partner Violence: Not on file    Family History  Problem Relation Age of Onset   Depression Mother    Depression Maternal Grandmother      Review of Systems  Constitutional: Negative.  Negative for chills and fever.  HENT: Negative.  Negative for congestion and sore throat.   Eyes: Negative.   Respiratory: Negative.  Negative for cough and shortness of breath.   Cardiovascular:  Positive for palpitations. Negative for chest pain.  Gastrointestinal: Negative.  Negative for abdominal pain, nausea and vomiting.  Genitourinary: Negative.   Skin: Negative.  Negative for rash.  Neurological: Negative.  Negative for dizziness and headaches.  All other systems reviewed and are negative.   Today's Vitals   12/17/21 0912 12/17/21 0918  BP: (!) 160/84 (!) 156/88  Pulse: (!) 112   Temp: 97.7 F (36.5 C)   TempSrc: Oral   SpO2: 98%   Weight: 171 lb 6 oz (77.7 kg)   Height: 5\' 9"  (1.753 m)    Body mass index is 25.31 kg/m. Wt Readings from Last 3 Encounters:  12/17/21 171 lb 6 oz (77.7 kg)  10/28/21 170 lb (77.1 kg)  09/12/21 173 lb (78.5 kg)    Physical  Exam Vitals reviewed.  Constitutional:      Appearance: Normal appearance.  HENT:     Head: Normocephalic.     Mouth/Throat:     Mouth: Mucous membranes are moist.     Pharynx: Oropharynx is clear.  Eyes:     Extraocular Movements: Extraocular movements intact.     Conjunctiva/sclera: Conjunctivae normal.     Pupils: Pupils are equal, round, and reactive to light.  Cardiovascular:     Rate and Rhythm: Normal rate and regular rhythm.     Pulses: Normal pulses.     Heart sounds: Normal heart sounds.  Pulmonary:     Effort: Pulmonary effort is normal.     Breath sounds: Normal breath sounds.  Musculoskeletal:     Cervical back: No tenderness.     Right lower leg: No edema.     Left lower leg: No edema.  Lymphadenopathy:     Cervical: No cervical adenopathy.  Skin:    General: Skin is warm and dry.     Capillary Refill: Capillary refill takes less than 2 seconds.  Neurological:     General: No focal deficit present.     Mental Status: He is alert and oriented to person, place, and time.  Psychiatric:        Mood and Affect: Mood normal.        Behavior: Behavior normal.   EKG: Normal sinus rhythm with ventricular rate of 80.  No acute ischemic changes.  DG Chest 2 View  Result Date: 12/17/2021 CLINICAL DATA:  New onset A-fib EXAM: CHEST - 2 VIEW COMPARISON:  None Available. FINDINGS: The heart size and mediastinal contours are within normal limits. Both lungs are clear. No visible pleural effusions or pneumothorax. No acute osseous abnormality. IMPRESSION: No active cardiopulmonary disease. Electronically Signed   By: Feliberto HartsFrederick S Jones M.D.   On: 12/17/2021 10:10     ASSESSMENT & PLAN: A total of 47 minutes was spent with the patient and counseling/coordination of care regarding preparing for this visit, review of most recent office visit notes, review of multiple chronic medical problems and their management, review of all medications, findings of new onset atrial  fibrillation and need for cardiology evaluation, elevated blood pressure and need for medication, review of chest x-ray report, need for blood work, need for cessation of Vyvanse medication, prognosis, education on nutrition, documentation and need for follow-up after cardiology evaluation.  Problem List Items Addressed This Visit       Cardiovascular and Mediastinum   Paroxysmal atrial fibrillation (HCC) - Primary    New onset.  Symptomatic at the time. Most likely related to Vyvanse use. Normal EKG today.  Normal sinus rhythm. Advised to stop Vyvanse. Needs cardiology evaluation. Referral placed today. Blood pressure reading elevated today. In the meantime, we will start metoprolol succinate 25 mg daily. Follow-up after cardiology evaluation.      Relevant  Medications   metoprolol succinate (TOPROL-XL) 25 MG 24 hr tablet   Other Relevant Orders   EKG 12-Lead   Ambulatory referral to Cardiology   CBC with Differential/Platelet   Comprehensive metabolic panel   Hemoglobin A1c   Thyroid Panel With TSH   DG Chest 2 View (Completed)     Other   Drug reaction    Irregular tachycardia most likely secondary to Vyvanse use. Advised to stop Vyvanse.      Tachycardia    Most likely atrial fibrillation. May benefit from beta-blocker treatment with metoprolol succinate 25 mg daily. Needs cardiology evaluation.  Referral placed today.      Relevant Medications   metoprolol succinate (TOPROL-XL) 25 MG 24 hr tablet   Patient Instructions  Atrial Fibrillation  Atrial fibrillation is a type of heartbeat that is irregular or fast. If you have this condition, your heart beats without any order. This makes it hard for your heart to pump blood in a normal way. Atrial fibrillation may come and go, or it may become a long-lasting problem. If this condition is not treated, it can put you at higher risk for stroke, heart failure, and other heart problems. What are the causes? This condition  may be caused by diseases that damage the heart. They include: High blood pressure. Heart failure. Heart valve disease. Heart surgery. Other causes include: Diabetes. Thyroid disease. Being overweight. Kidney disease. Sometimes the cause is not known. What increases the risk? You are more likely to develop this condition if: You are older. You smoke. You exercise often and very hard. You have a family history of this condition. You are a man. You use drugs. You drink a lot of alcohol. You have lung conditions, such as emphysema, pneumonia, or COPD. You have sleep apnea. What are the signs or symptoms? Common symptoms of this condition include: A feeling that your heart is beating very fast. Chest pain or discomfort. Feeling short of breath. Suddenly feeling light-headed or weak. Getting tired easily during activity. Fainting. Sweating. In some cases, there are no symptoms. How is this treated? Treatment for this condition depends on underlying conditions and how you feel when you have atrial fibrillation. They include: Medicines to: Prevent blood clots. Treat heart rate or heart rhythm problems. Using devices, such as a pacemaker, to correct heart rhythm problems. Doing surgery to remove the part of the heart that sends bad signals. Closing an area where clots can form in the heart (left atrial appendage). In some cases, your doctor will treat other underlying conditions. Follow these instructions at home: Medicines Take over-the-counter and prescription medicines only as told by your doctor. Do not take any new medicines without first talking to your doctor. If you are taking blood thinners: Talk with your doctor before you take any medicines that have aspirin or NSAIDs, such as ibuprofen, in them. Take your medicine exactly as told by your doctor. Take it at the same time each day. Avoid activities that could hurt or bruise you. Follow instructions about how to  prevent falls. Wear a bracelet that says you are taking blood thinners. Or, carry a card that lists what medicines you take. Lifestyle     Do not use any products that have nicotine or tobacco in them. These include cigarettes, e-cigarettes, and chewing tobacco. If you need help quitting, ask your doctor. Eat heart-healthy foods. Talk with your doctor about the right eating plan for you. Exercise regularly as told by your doctor. Do not drink  alcohol. Lose weight if you are overweight. Do not use drugs, including cannabis. General instructions If you have a condition that causes breathing to stop for a short period of time (apnea), treat it as told by your doctor. Keep a healthy weight. Do not use diet pills unless your doctor says they are safe for you. Diet pills may make heart problems worse. Keep all follow-up visits as told by your doctor. This is important. Contact a doctor if: You notice a change in the speed, rhythm, or strength of your heartbeat. You are taking a blood-thinning medicine and you get more bruising. You get tired more easily when you move or exercise. You have a sudden change in weight. Get help right away if:  You have pain in your chest or your belly (abdomen). You have trouble breathing. You have side effects of blood thinners, such as blood in your vomit, poop (stool), or pee (urine), or bleeding that cannot stop. You have any signs of a stroke. "BE FAST" is an easy way to remember the main warning signs: B - Balance. Signs are dizziness, sudden trouble walking, or loss of balance. E - Eyes. Signs are trouble seeing or a change in how you see. F - Face. Signs are sudden weakness or loss of feeling in the face, or the face or eyelid drooping on one side. A - Arms. Signs are weakness or loss of feeling in an arm. This happens suddenly and usually on one side of the body. S - Speech. Signs are sudden trouble speaking, slurred speech, or trouble understanding  what people say. T - Time. Time to call emergency services. Write down what time symptoms started. You have other signs of a stroke, such as: A sudden, very bad headache with no known cause. Feeling like you may vomit (nausea). Vomiting. A seizure. These symptoms may be an emergency. Do not wait to see if the symptoms will go away. Get medical help right away. Call your local emergency services (911 in the [place] Do not drive yourself to the hospital. Summary Atrial fibrillation is a type of heartbeat that is irregular or fast. You are at higher risk of this condition if you smoke, are older, have diabetes, or are overweight. Follow your doctor's instructions about medicines, diet, exercise, and follow-up visits. Get help right away if you have signs or symptoms of a stroke. Get help right away if you cannot catch your breath, or you have chest pain or discomfort. This information is not intended to replace advice given to you by your health care provider. Make sure you discuss any questions you have with your health care provider. Document Revised: 09/08/2018 Document Reviewed: 09/08/2018 Elsevier Patient Education  2023 Elsevier Inc.    Edwina Barth, MD [facility]

## 2021-12-17 NOTE — Assessment & Plan Note (Addendum)
New onset.  Symptomatic at the time. Most likely related to Vyvanse use. Normal EKG today.  Normal sinus rhythm. Advised to stop Vyvanse. Needs cardiology evaluation. Referral placed today. Blood pressure reading elevated today. In the meantime, we will start metoprolol succinate 25 mg daily. Follow-up after cardiology evaluation.

## 2021-12-17 NOTE — Assessment & Plan Note (Signed)
Irregular tachycardia most likely secondary to Vyvanse use. Advised to stop Vyvanse.

## 2021-12-18 LAB — THYROID PANEL WITH TSH
Free Thyroxine Index: 2.4 (ref 1.4–3.8)
T3 Uptake: 30 % (ref 22–35)
T4, Total: 8 ug/dL (ref 5.1–10.3)
TSH: 2.02 mIU/L (ref 0.40–4.50)

## 2021-12-25 ENCOUNTER — Telehealth (HOSPITAL_BASED_OUTPATIENT_CLINIC_OR_DEPARTMENT_OTHER): Payer: 59 | Admitting: Psychiatry

## 2021-12-25 ENCOUNTER — Encounter (HOSPITAL_COMMUNITY): Payer: Self-pay | Admitting: Psychiatry

## 2021-12-25 VITALS — Wt 174.0 lb

## 2021-12-25 DIAGNOSIS — F845 Asperger's syndrome: Secondary | ICD-10-CM

## 2021-12-25 DIAGNOSIS — F902 Attention-deficit hyperactivity disorder, combined type: Secondary | ICD-10-CM

## 2021-12-25 DIAGNOSIS — F840 Autistic disorder: Secondary | ICD-10-CM

## 2021-12-25 MED ORDER — ATOMOXETINE HCL 18 MG PO CAPS: 18.0000 mg | ORAL_CAPSULE | Freq: Every day | ORAL | 1 refills | Status: AC

## 2021-12-25 NOTE — Progress Notes (Signed)
Virtual Visit via Video Note  I connected with Jackson Trevino on 12/25/21 at 10:20 AM EDT by a video enabled telemedicine application and verified that I am speaking with the correct person using two identifiers.  Location: Patient: Home Provider: Home Office   I discussed the limitations of evaluation and management by telemedicine and the availability of in person appointments. The patient expressed understanding and agreed to proceed.  History of Present Illness: Patient is evaluated by video session.  He is a 21 year old Caucasian, married employed man who was seen first time 6 weeks ago for symptoms of ADHD.  He was taking instant release Adderall 15 mg 2 times a day and having side effects like insomnia, anxiety, fluctuation of heartbeat and diarrhea.  Earlier he was on extended release but it was switch to instant release for a better outcome.  We have recommended to stop the Adderall instant release and try Vyvanse 30 mg.  Patient told after trying for a week and a half he had an incident which he described increased blood pressure, increased heart rate and his Apple Watch shows A-fib and SVT.  He stopped taking the Vyvanse and consult with his PCP who started him on metoprolol.  He is no longer taking Vyvanse and waiting for his cardiology appointment on October 10.  Patient told that episode last for 10 to 15 minutes.  Patient has no previous history of cardiac event.  He reported since he is not taking Vyvanse he struggle at work and he noticed his focus and attention is not as good.  However he is able to manage the symptoms by relaxing himself.  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  He reported his grades are mostly B's and A's and sometime he is behind in his school assignments.  Also noticed 3 pounds weight gain but he is sleeping better.  He started putting his curtain down and that helps.  He reported his  rage level is low and sometimes difficulty multitasking.  He denies any crying spells or any feeling of hopelessness.  He reported the feeling of sadness is subsided and he is spending good time with his 38 weeks old newborn.  He denies any suicidal thoughts, agitation, anger, mania or any hallucination.  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  Tried medication but became sleepy and stopped.  In college given Zoloft but felt derealization, tried Prozac but stopped.  No history of suicidal attempt, inpatient psychiatric treatment, psychosis, hallucination or paranoia.  No history of panic attacks, OCD.  PCP tried Adderall XR but having significant side effects and switched to instant release Adderall.  Recent Results (from the past 2160 hour(s))  CBC with Differential/Platelet     Status: None   Collection Time: 12/17/21 10:03 AM  Result Value Ref Range   WBC 6.5 4.5 - 10.5 K/uL   RBC 5.61 4.22 - 5.81 Mil/uL   Hemoglobin 15.2 13.0 - 17.0 g/dL   HCT 45.8 39.0 - 52.0 %   MCV 81.6 78.0 - 100.0 fl   MCHC 33.2 30.0 - 36.0 g/dL   RDW 13.2 11.5 - 14.6 %   Platelets 242.0 150.0 - 400.0 K/uL   Neutrophils Relative % 47.7 43.0 - 77.0 %   Lymphocytes Relative 39.6 12.0 - 46.0 %   Monocytes Relative 7.9 3.0 - 12.0 %   Eosinophils Relative 3.8 0.0 -  5.0 %   Basophils Relative 1.0 0.0 - 3.0 %   Neutro Abs 3.1 1.4 - 7.7 K/uL   Lymphs Abs 2.6 0.7 - 4.0 K/uL   Monocytes Absolute 0.5 0.1 - 1.0 K/uL   Eosinophils Absolute 0.2 0.0 - 0.7 K/uL   Basophils Absolute 0.1 0.0 - 0.1 K/uL  Comprehensive metabolic panel     Status: None   Collection Time: 12/17/21 10:03 AM  Result Value Ref Range   Sodium 140 135 - 145 mEq/L   Potassium 3.7 3.5 - 5.1 mEq/L   Chloride 103 96 - 112 mEq/L   CO2 29 19 - 32 mEq/L   Glucose, Bld 94 70 - 99 mg/dL   BUN 12 6 - 23 mg/dL   Creatinine, Ser 1.19 0.40 - 1.50 mg/dL   Total Bilirubin 0.8 0.2 - 1.2 mg/dL    Alkaline Phosphatase 62 39 - 117 U/L   AST 12 0 - 37 U/L   ALT 10 0 - 53 U/L   Total Protein 7.4 6.0 - 8.3 g/dL   Albumin 4.6 3.5 - 5.2 g/dL   GFR 147.82 >95.62 mL/min    Comment: Calculated using the CKD-EPI Creatinine Equation (2021)   Calcium 9.8 8.4 - 10.5 mg/dL  Hemoglobin Z3Y     Status: None   Collection Time: 12/17/21 10:03 AM  Result Value Ref Range   Hgb A1c MFr Bld 5.4 4.6 - 6.5 %    Comment: Glycemic Control Guidelines for People with Diabetes:Non Diabetic:  <6%Goal of Therapy: <7%Additional Action Suggested:  >8%   Thyroid Panel With TSH     Status: None   Collection Time: 12/17/21 10:03 AM  Result Value Ref Range   T3 Uptake 30 22 - 35 %   T4, Total 8.0 5.1 - 10.3 mcg/dL   Free Thyroxine Index 2.4 1.4 - 3.8   TSH 2.02 0.40 - 4.50 mIU/L        Psychiatric Specialty Exam: Physical Exam  Review of Systems  Weight 174 lb (78.9 kg).There is no height or weight on file to calculate BMI.  General Appearance: Casual  Eye Contact:  Fair  Speech:  Clear and Coherent and Normal Rate  Volume:  Normal  Mood:  Anxious  Affect:  Congruent  Thought Process:  Goal Directed  Orientation:  Full (Time, Place, and Person)  Thought Content:  Logical  Suicidal Thoughts:  No  Homicidal Thoughts:  No  Memory:  Immediate;   Good Recent;   Good Remote;   Good  Judgement:  Intact  Insight:  Present  Psychomotor Activity:  Normal  Concentration:  Concentration: Good and Attention Span: Good  Recall:  Good  Fund of Knowledge:  Good  Language:  Good  Akathisia:  No  Handed:  Right  AIMS (if indicated):     Assets:  Communication Skills Desire for Improvement Housing Social Support Talents/Skills Transportation  ADL's:  Intact  Cognition:  WNL  Sleep:   better with curtains down.     Assessment and Plan: ADHD, combined type.  Asperger disorder, Autism spectrum disorder.  I review notes from PCP, current medication.  Patient has episode of A-fib and SVT and now is  scheduled to see a cardiologist.  He is no longer taking Vyvanse.  He is taking metoprolol prescribed by PCP.  I reviewed blood work results.  I recommend to discontinue Vyvanse and try not any more stimulant until he had complete cardiac workup.  We talk about trying a nonstimulant Strattera however  he still wants complete cardiac workup before he can take the Strattera.  Patient agreed with the plan.  Though I have provided a prescription of Strattera 18 mg but he will not take until he had appointment with a cardiologist.  I recommend to call us back if he has any question, concern or if he feels worsening of the symptoms.  We will follow up in 2 months.  I will forward my note to his PCP and his cardiologist.   Follow Up Instructions:    I discussed the assessment and treatment plan with the patient. The patient was provided an opportunity to ask questions and all were answered. The patient agreed with the plan and demonstrated an understanding of the instructions.   The patient was advised to call back or seek an in-person evaluation if the symptoms worsen or if the condition fails to improve as anticipated.  Collaboration of Care: Other provider involved in patient's care AEB notes are available in epic to review.  Patient/Guardian was advised Release of Information must be obtained prior to any record release in order to collaborate their care with an outside provider. Patient/Guardian was advised if they have not already done so to contact the registration department to sign all necessary forms in order for [place] to release information regarding their care.   Consent: Patient/Guardian gives verbal consent for treatment and assignment of benefits for services provided during this visit. Patient/Guardian expressed understanding and agreed to proceed.    I provided 28 minutes of non-face-to-face time during this encounter.   Cleotis Nipper, MD

## 2022-01-07 ENCOUNTER — Ambulatory Visit: Payer: 59 | Attending: Cardiovascular Disease | Admitting: Cardiovascular Disease

## 2022-01-07 ENCOUNTER — Encounter: Payer: Self-pay | Admitting: Cardiovascular Disease

## 2022-01-07 ENCOUNTER — Emergency Department (HOSPITAL_COMMUNITY)
Admission: EM | Admit: 2022-01-07 | Discharge: 2022-01-07 | Disposition: A | Discharge status: Home / Self Care | Payer: 59 | Attending: Emergency Medicine | Admitting: Emergency Medicine

## 2022-01-07 ENCOUNTER — Emergency Department (HOSPITAL_COMMUNITY): Payer: 59

## 2022-01-07 VITALS — BP 128/72 | HR 64 | Ht 69.0 in | Wt 176.2 lb

## 2022-01-07 DIAGNOSIS — R531 Weakness: Secondary | ICD-10-CM | POA: Diagnosis not present

## 2022-01-07 DIAGNOSIS — I4719 Other supraventricular tachycardia: Secondary | ICD-10-CM

## 2022-01-07 DIAGNOSIS — R42 Dizziness and giddiness: Secondary | ICD-10-CM | POA: Insufficient documentation

## 2022-01-07 DIAGNOSIS — R002 Palpitations: Secondary | ICD-10-CM | POA: Diagnosis present

## 2022-01-07 LAB — TSH: TSH: 1.632 u[IU]/mL (ref 0.350–4.500)

## 2022-01-07 LAB — RAPID URINE DRUG SCREEN, HOSP PERFORMED
Amphetamines: NOT DETECTED
Barbiturates: NOT DETECTED
Benzodiazepines: NOT DETECTED
Cocaine: NOT DETECTED
Opiates: NOT DETECTED
Tetrahydrocannabinol: NOT DETECTED

## 2022-01-07 LAB — BASIC METABOLIC PANEL
Anion gap: 9 (ref 5–15)
BUN: 12 mg/dL (ref 6–20)
CO2: 24 mmol/L (ref 22–32)
Calcium: 9.3 mg/dL (ref 8.9–10.3)
Chloride: 105 mmol/L (ref 98–111)
Creatinine, Ser: 1.07 mg/dL (ref 0.61–1.24)
GFR, Estimated: >60 mL/min (ref >60)
Glucose, Bld: 93 mg/dL (ref 70–99)
Potassium: 3.5 mmol/L (ref 3.5–5.1)
Sodium: 138 mmol/L (ref 135–145)

## 2022-01-07 LAB — CBC WITH DIFFERENTIAL/PLATELET
Abs Immature Granulocytes: 0.01 10*3/uL (ref 0.00–0.07)
Basophils Absolute: 0.1 10*3/uL (ref 0.0–0.1)
Basophils Relative: 1 %
Eosinophils Absolute: 0.1 10*3/uL (ref 0.0–0.5)
Eosinophils Relative: 2 %
HCT: 44.9 % (ref 39.0–52.0)
Hemoglobin: 14.7 g/dL (ref 13.0–17.0)
Immature Granulocytes: 0 %
Lymphocytes Relative: 25 %
Lymphs Abs: 1.5 10*3/uL (ref 0.7–4.0)
MCH: 27 pg (ref 26.0–34.0)
MCHC: 32.7 g/dL (ref 30.0–36.0)
MCV: 82.5 fL (ref 80.0–100.0)
Monocytes Absolute: 0.5 10*3/uL (ref 0.1–1.0)
Monocytes Relative: 9 %
Neutro Abs: 3.9 10*3/uL (ref 1.7–7.7)
Neutrophils Relative %: 63 %
Platelets: 252 10*3/uL (ref 150–400)
RBC: 5.44 MIL/uL (ref 4.22–5.81)
RDW: 12.5 % (ref 11.5–15.5)
WBC: 6.2 10*3/uL (ref 4.0–10.5)
nRBC: 0 % (ref 0.0–0.2)

## 2022-01-07 LAB — MAGNESIUM: Magnesium: 1.9 mg/dL (ref 1.7–2.4)

## 2022-01-07 NOTE — ED Triage Notes (Signed)
BIB [facility]. 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 Was seen by PCP for this and per EMS this is the same thing that occurred that brought him tonight. C/o palpitations, weakness, SOB, and dizzy. When placed on the monitor rate varying 70-150, with abnormal ECG monitoring that was ST irregular. Improvement of dizziness and weakness.

## 2022-01-07 NOTE — Patient Instructions (Signed)
Medication Instructions:  You  may take an extra 25 mg Metoprolol Succinate daily as needed for palpitations.   *If you need a refill on your cardiac medications before your next appointment, please call your pharmacy*   Lab Work: None ordered If you have labs (blood work) drawn today and your tests are completely normal, you will receive your results only by: [place] (if you have MyChart) OR A paper copy in the mail If you have any lab test that is abnormal or we need to change your treatment, we will call you to review the results.   Testing/Procedures: Your physician has requested that you have an echocardiogram. Echocardiography is a painless test that uses sound waves to create images of your heart. It provides your doctor with information about the size and shape of your heart and how well your heart's chambers and valves are working. You may receive an ultrasound enhancing agent through an IV if needed to better visualize your heart during the echo.This procedure takes approximately one hour. There are no restrictions for this procedure. This will take place at the [address], [address].    Follow-Up: At [facility], you and your health needs are our priority.  As part of our continuing mission to provide you with exceptional heart care, we have created designated Provider Care Teams.  These Care Teams include your primary Cardiologist (physician) and Advanced Practice Providers (APPs -  Physician Assistants and Nurse Practitioners) who all work together to provide you with the care you need, when you need it.  We recommend signing up for the patient portal called "MyChart".  Sign up information is provided on this After Visit Summary.  MyChart is used to connect with patients for Virtual Visits (Telemedicine).  Patients are able to view lab/test results, encounter notes, upcoming appointments, etc.  Non-urgent messages can be sent to your provider as well.   To  learn more about what you can do with MyChart, go to [email or web address].    Your next appointment:   2 month(s)  The format for your next appointment:   In Person  Provider:   Dr. Sallyanne Kuster

## 2022-01-07 NOTE — Progress Notes (Signed)
Cardiology Office Note:    Date:  01/07/2022   ID:  [name], DOB [birth year], MRN [id number]  PCP:  Horald Pollen, [place] [facility] Cardiologist:  Sanda Klein, MD     Referring MD: Horald Pollen, *   No chief complaint on file. Jackson Trevino is a 21 y.o. male who is being seen today for the evaluation of arrhythmia at the request of Horald Pollen, *.   History of Present Illness:    Jackson Trevino is a 21 y.o. 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  The palpitations only occurred after he started taking Adderall and subsequently Vyvanse for ADD.  These medications have helped with his ability to focus at work and in daily life, but unfortunately have been associated with frequent palpitations.  He first noticed them subjectively as far back as June, but they have been worse over the last 3 weeks or so.  He initially took Adderall, but this caused issues like insomnia, anxiety, palpitations and diarrhea.  He then switched over to Vyvanse.  He has a smart watch that he has used to record rhythm during palpitations.  Roughly 3 weeks ago he took Vyvanse at around 6 PM and an hour later had tachycardia with rates up to 170 bpm according to his smart watch.  His automatic watch algorithm identified the rhythm as being atrial fibrillation, but review of the tracing shows what appears to be bursts of atrial tachycardia and frequent PACs on a background of sinus rhythm.  His blood pressure was also markedly elevated at 190/150 mmHg.  He was subsequently prescribed metoprolol succinate 25 mg daily with improvement in his palpitations.  Yesterday evening after taking his Vyvanse around 6 PM he had rapid palpitations at 155 bpm and felt short of breath.   His blood pressure was again elevated.  He works as an Public relations account executive and was able to get an EKG tracing/3-lead rhythm strip which shows periods of what appears to be ectopic atrial tachycardia interspersed with normal sinus rhythm and frequent premature atrial complexes.  There appears to be 2 separate P wave morphologies.  Little hard to separate the onset of arrhythmia from respiratory arrhythmia of youth, which is also very prominent on all his tracings.    The tracings are not compatible with atrial fibrillation.  All the periods of tachycardia have a long RP mechanism and do not suggest AV node reentry or AV reentry tachycardia due to accessory pathway.  In between the episodes of palpitations he feels very well.  He does not have any issues of shortness of breath or chest pain at rest or with activity.  He enjoys playing basketball and exercising.  He denies lower extremity edema, dizziness, full-blown syncope, focal neurological complaints.  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  He has not had any problems with full joint dislocations.  After he was prescribed metoprolol he felt some fatigue and sleepiness, but when he switched to taking the medication at bedtime (he works night shift so bedtime for him is 11 AM), his fatigue and sleepiness have no longer been a problem.  In seventh grade he underwent an echocardiogram when he had atypical chest discomfort.  This was performed either at [facility] or  [employer] [facility] where his father was stationed in [employer].  He was told that it was normal.  Records are not available.  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  Past Medical History:  Diagnosis Date   Arrhythmia June 2023   Self noted June first noted by provider september   Asthma Childhood   No longer have   Hypertension September 2023    Past Surgical  History:  Procedure Laterality Date   RHINOPLASTY      Current Medications: Current Meds  Medication Sig   loperamide (IMODIUM) 2 MG capsule Take 2 capsules (4 mg total) by mouth as needed for diarrhea or loose stools.   metoprolol succinate (TOPROL-XL) 25 MG 24 hr tablet Take 1 tablet (25 mg total) by mouth daily.   omeprazole (PRILOSEC) 20 MG capsule Take 20 mg by mouth daily.     Allergies:   Azithromycin   Social History   Socioeconomic History   Marital status: Married    Spouse name: Not on file   Number of children: Not on file   Years of education: Not on file   Highest education level: Not on file  Occupational History   Not on file  Tobacco Use   Smoking status: Never   Smokeless tobacco: Never  Substance and Sexual Activity   Alcohol use: Not Currently   Drug use: Never   Sexual activity: Yes    Birth control/protection: Condom, Pill  Other Topics Concern   Not on file  Social History Narrative   Not on file   Social Determinants of Health   Financial Resource Strain: Not on file  Food Insecurity: Not on file  Transportation Needs: Not on file  Physical Activity: Not on file  Stress: Not on file  Social Connections: Not on file     Family History: The patient's family history includes Clotting disorder in his sister; Depression in his maternal grandmother and mother; Hypertension in his father.  His father and his sister share a very rare connective tissue disorder with him that involves joints abnormalities and hearing difficulty due to middle ear problems.  ROS:   Please see the history of present illness.     All other systems reviewed and are negative.  EKGs/Labs/Other Studies Reviewed:    The following studies were reviewed today: Rhythm recordings from patient's smart watch and rhythm strip from EMS      EKG:  EKG is ordered today.  The ekg ordered today demonstrates sinus rhythm with sinus arrhythmia of youth and a single premature  atrial contraction with aberrant conduction.  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  No repolarization abnormalities, QTc 404 ms.  Recent Labs: 12/17/2021: ALT 10 01/07/2022: BUN 12; Creatinine, Ser 1.07; Hemoglobin 14.7; Magnesium 1.9; Platelets 252; Potassium 3.5; Sodium 138; TSH 1.632  Recent Lipid Panel No results found for: "CHOL", "TRIG", "HDL", "CHOLHDL", "VLDL", "LDLCALC", "LDLDIRECT"   Risk Assessment/Calculations:                Physical Exam:    VS:  BP 128/72 (BP Location: Left Arm, Patient Position: Sitting, Cuff Size: Normal)   Pulse 64   Ht 5\' 9"  (1.753 m)   Wt 176 lb 3.2 oz (79.9 kg)   SpO2 99%   BMI 26.02 kg/m     Wt Readings from Last  3 Encounters:  01/07/22 176 lb 3.2 oz (79.9 kg)  12/25/21 174 lb (78.9 kg)  12/17/21 171 lb 6 oz (77.7 kg)     GEN:  Well nourished, well developed in no acute distress HEENT: Normal NECK: No JVD; No carotid bruits LYMPHATICS: No lymphadenopathy CARDIAC: RRR with occasional ectopic beats and very prominent respiratory sinus arrhythmia, no murmurs, rubs, gallops RESPIRATORY:  Clear to auscultation without rales, wheezing or rhonchi  ABDOMEN: Soft, non-tender, non-distended MUSCULOSKELETAL:  No edema; No deformity  SKIN: Warm and dry NEUROLOGIC:  Alert and oriented x 3 wears hearing aids PSYCHIATRIC:  Normal affect   ASSESSMENT:    1. Atrial tachycardia    PLAN:    In order of problems listed above:  PACs/atrial tachycardia: Arrhythmia has only become an issue after he started taking stimulants and it seems to have subsided once he stopped taking Vyvanse.  Unfortunately, the amphetamines were helping with his ability to focus and concentrate.  I think he may do better with nonstimulant medications such as the Strattera that has been prescribed for him.  We will check an echocardiogram to make sure there is no  anatomical substrate for his arrhythmia.  Okay to take an extra dose of metoprolol succinate on days when the palpitations are more prominent.  There is no evidence of atrial fibrillation, ventricular arrhythmia, preexcitation, long QT syndrome or Brugada syndrome based on his EKG.  The nonspecific intraventricular conduction delay is noted, but less there are abnormalities of the right ventricle seen on the echocardiogram I do not think he needs evaluation for uncommon disorder such as ARVC.  No ventricular arrhythmia has been detected to date.  Also recommended limiting the intake of other stimulants such as coffee and energy drinks.  If switching to a nonstimulant medication for ADD and taking beta-blockers does not control his symptomatic palpitations, can consider referral to EP to discuss alternative antiarrhythmic such as sotalol or even discussing ablation (unfortunately the arrhythmia appears to have an automatic mechanism, not reentry so therefore may not be ideal for ablation).           Medication Adjustments/Labs and Tests Ordered: Current medicines are reviewed at length with the patient today.  Concerns regarding medicines are outlined above.  Orders Placed This Encounter  Procedures   EKG 12-Lead   ECHOCARDIOGRAM COMPLETE   No orders of the defined types were placed in this encounter.   Patient Instructions  Medication Instructions:  You  may take an extra 25 mg Metoprolol Succinate daily as needed for palpitations.   *If you need a refill on your cardiac medications before your next appointment, please call your pharmacy*   Lab Work: None ordered If you have labs (blood work) drawn today and your tests are completely normal, you will receive your results only by: MyChart Message (if you have MyChart) OR A paper copy in the mail If you have any lab test that is abnormal or we need to change your treatment, we will call you to review the  results.   Testing/Procedures: Your physician has requested that you have an echocardiogram. Echocardiography is a painless test that uses sound waves to create images of your heart. It provides your doctor with information about the size and shape of your heart and how well your heart's chambers and valves are working. You may receive an ultrasound enhancing agent through an IV if needed to better visualize your heart during the echo.This procedure takes approximately one hour. There are no restrictions  for this procedure. This will take place at the [address].    Follow-Up: At [facility], you and your health needs are our priority.  As part of our continuing mission to provide you with exceptional heart care, we have created designated Provider Care Teams.  These Care Teams include your primary Cardiologist (physician) and Advanced Practice Providers (APPs -  Physician Assistants and Nurse Practitioners) who all work together to provide you with the care you need, when you need it.  We recommend signing up for the patient portal called "MyChart".  Sign up information is provided on this After Visit Summary.  MyChart is used to connect with patients for Virtual Visits (Telemedicine).  Patients are able to view lab/test results, encounter notes, upcoming appointments, etc.  Non-urgent messages can be sent to your provider as well.   To learn more about what you can do with MyChart, go to [email or web address].    Your next appointment:   2 month(s)  The format for your next appointment:   In Person  Provider:   Dr. Royann Shivers     Signed, Jackson Fair, MD  01/07/2022 5:03 PM     [facility]

## 2022-01-07 NOTE — Discharge Instructions (Signed)
You were seen today for palpitations and tachycardia.  Your work-up is reassuring.  Follow-up with cardiology today as scheduled.  They can adjust medications and provide further evaluation.  Avoid any significant caffeine or stimulant use.

## 2022-01-07 NOTE — ED Provider Notes (Signed)
[facility] EMERGENCY DEPARTMENT Provider Note   CSN: [id number] Arrival date & time: 01/07/22  0008     History  Chief Complaint  Patient presents with   Palpitations   Weakness   Dizziness    Jackson Trevino is a 21 y.o. male.  HPI     This is a 21 year old male who presents with palpitations and dizziness.  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  Patient states that he had onset of palpitations, dizziness, diaphoresis at work.  He was noted to have heart rates from 100-150.  He had a similar episode approximately 1 month ago.  He has since followed up with his primary physician was placed on metoprolol.  He used to take Adderall and Vyvanse but stopped taking these after the episode several weeks ago.  He does report today he drank an energy drink but he states it is not abnormal for him to have coffee.  Denies alcohol or drug use.  No other change in medications.  Denies chest pain but just describes palpitations.  No shortness of breath or history of blood clots.  No known thyroid disorders.  Of note, he does have some increased stressors with a new baby at home.  Per EMS, heart rate noted to be irregular.  Home Medications Prior to Admission medications   Medication Sig Start Date End Date Taking? Authorizing Provider  atomoxetine (STRATTERA) 18 MG capsule Take 1 capsule (18 mg total) by mouth daily. 12/25/21 12/25/22  Arfeen, Arlyce Harman, MD  lisdexamfetamine (VYVANSE) 30 MG capsule Take 1 capsule (30 mg total) by mouth daily. Patient not taking: Reported on 12/25/2021 11/18/21   Arfeen, Arlyce Harman, MD  loperamide (IMODIUM) 2 MG capsule Take 2 capsules (4 mg total) by mouth as needed for diarrhea or loose stools. 11/04/21   Mar Daring, PA-C  metoprolol succinate (TOPROL-XL) 25 MG 24 hr tablet Take 1 tablet (25 mg total) by mouth daily. 12/17/21   Horald Pollen, MD  omeprazole (PRILOSEC) 20 MG capsule Take 20 mg by mouth daily.    [provider]      Allergies    Azithromycin    Review of Systems   Review of Systems  Constitutional:  Negative for fever.  Respiratory:  Negative for shortness of breath.   Cardiovascular:  Positive for palpitations. Negative for chest pain and leg swelling.  All other systems reviewed and are negative.   Physical Exam Updated Vital Signs BP 128/89 (BP Location: Right Arm)   Pulse 69   Temp 98.3 F (36.8 C) (Oral)   Resp 17   SpO2 99%  Physical Exam Vitals and nursing note reviewed.  Constitutional:      Appearance: He is well-developed. He is not ill-appearing.  HENT:     Head: Normocephalic and atraumatic.  Eyes:     Pupils: Pupils are equal, round, and reactive to light.  Cardiovascular:     Rate and Rhythm: Tachycardia present. Rhythm irregular.     Heart sounds: Normal heart sounds. No murmur heard. Pulmonary:     Effort: Pulmonary effort is normal. No respiratory distress.     Breath sounds: Normal breath sounds. No wheezing.  Abdominal:     General: Bowel sounds are normal.     Palpations: Abdomen is soft.     Tenderness: There is no abdominal tenderness. There is no rebound.  Musculoskeletal:        General: No tenderness.  Cervical back: Neck supple.     Right lower leg: No edema.     Left lower leg: No edema.  Lymphadenopathy:     Cervical: No cervical adenopathy.  Skin:    General: Skin is warm and dry.  Neurological:     Mental Status: He is alert and oriented to person, place, and time.  Psychiatric:        Mood and Affect: Mood normal.     ED Results / Procedures / Treatments   Labs (all labs ordered are listed, but only abnormal results are displayed) Labs Reviewed  CBC WITH DIFFERENTIAL/PLATELET  BASIC METABOLIC PANEL  MAGNESIUM  TSH  RAPID URINE DRUG SCREEN, HOSP PERFORMED    EKG EKG Interpretation  Date/Time:  Tuesday January 07 2022 00:19:59 EDT Ventricular Rate:  101 PR Interval:  128 QRS Duration: 100 QT  Interval:  322 QTC Calculation: 417 R Axis:   50 Text Interpretation: Sinus tachycardia Inferior infarct , age undetermined Anterolateral infarct , age undetermined Abnormal ECG No previous ECGs available no prior Confirmed by Ross Marcus ([id number]) on 01/07/2022 2:42:21 AM  Radiology DG Chest 2 View  Result Date: 01/07/2022 CLINICAL DATA:  Palpitations starting a few weeks ago. EXAM: CHEST - 2 VIEW COMPARISON:  12/17/2021 FINDINGS: The heart size and mediastinal contours are within normal limits. Both lungs are clear. The visualized skeletal structures are unremarkable. IMPRESSION: No active cardiopulmonary disease. Electronically Signed   By: Burman Nieves M.D.   On: 01/07/2022 01:19    Procedures Procedures    Medications Ordered in ED Medications - No data to display  ED Course/ Medical Decision Making/ A&P                           Medical Decision Making Amount and/or Complexity of Data Reviewed Labs: ordered. Radiology: ordered.   This patient presents to the ED for concern of palpitations, this involves an extensive number of treatment options, and is a complaint that carries with it a high risk of complications and morbidity.  I considered the following differential and admission for this acute, potentially life threatening condition.  The differential diagnosis includes malignant arrhythmia, metabolic derangement, autonomic dysfunction  MDM:    This is a 21 year old male who presents with palpitations.  He is nontoxic and initial vital signs notable for heart rate of 101.  Reportedly irregular heart rate up to 140s to 150s prior to arrival.  He reports similar episodes in the past.  He is currently on metoprolol.  His exam is fairly benign.  Basic lab work including metabolites and thyroid studies sent.  Metabolites including magnesium are reassuring.  TSH is normal.  EKG shows sinus tachycardia with nonspecific changes.  No evidence of acute ischemia.  No malignant  arrhythmia.  Patient was on the monitor without any issue while in the emergency department.  He has follow-up with cardiology later today.  Recommend that he follow-up closely.  Would anticipate he will likely get an echo and a Holter monitor.  They can also adjust medications at that time.  (Labs, imaging, consults)  Labs: I Ordered, and personally interpreted labs.  The pertinent results include: CBC, BMP, magnesium, UDS  Imaging Studies ordered: I ordered imaging studies including chest x-ray I independently visualized and interpreted imaging. I agree with the radiologist interpretation  Additional history obtained from chart review.  External records from outside source obtained and reviewed including outpatient evaluations  Cardiac Monitoring: The patient  was maintained on a cardiac monitor.  I personally viewed and interpreted the cardiac monitored which showed an underlying rhythm of: Sinus rhythm  Reevaluation: After the interventions noted above, I reevaluated the patient and found that they have :improved  Social Determinants of Health: Lives independently  Disposition: Discharge  Co morbidities that complicate the patient evaluation No past medical history on file.   Medicines No orders of the defined types were placed in this encounter.   I have reviewed the patients home medicines and have made adjustments as needed  Problem List / ED Course: Problem List Items Addressed This Visit   None Visit Diagnoses     Palpitations    -  Primary                   Final Clinical Impression(s) / ED Diagnoses Final diagnoses:  Palpitations    Rx / DC Orders ED Discharge Orders     None         Shon Baton, MD 01/07/22 [phone]

## 2022-01-08 ENCOUNTER — Telehealth: Payer: Self-pay | Admitting: *Deleted

## 2022-01-08 NOTE — Telephone Encounter (Signed)
Transition Care Management Unsuccessful Follow-up Telephone Call  Date of discharge and from where:  01/07/2022 [facility]   Attempts:  1st Attempt  Reason for unsuccessful TCM follow-up call:  Left voice message    

## 2022-01-23 ENCOUNTER — Ambulatory Visit (HOSPITAL_COMMUNITY): Payer: 59 | Attending: Cardiovascular Disease

## 2022-01-23 DIAGNOSIS — I4719 Other supraventricular tachycardia: Secondary | ICD-10-CM | POA: Diagnosis present

## 2022-01-23 LAB — ECHOCARDIOGRAM COMPLETE
Area-P 1/2: 4.35 cm2
S' Lateral: 2.9 cm

## 2022-01-24 ENCOUNTER — Ambulatory Visit: Payer: 59 | Admitting: Physician Assistant

## 2022-02-24 ENCOUNTER — Telehealth (HOSPITAL_COMMUNITY): Payer: 59 | Admitting: Psychiatry

## 2022-02-26 ENCOUNTER — Telehealth (HOSPITAL_COMMUNITY): Payer: 59 | Admitting: Psychiatry

## 2022-03-06 ENCOUNTER — Ambulatory Visit: Payer: 59 | Admitting: Gastroenterology

## 2022-03-18 ENCOUNTER — Ambulatory Visit: Payer: 59 | Attending: Cardiovascular Disease | Admitting: Cardiovascular Disease

## 2022-03-27 ENCOUNTER — Encounter: Payer: Self-pay | Admitting: Cardiovascular Disease

## 2022-04-23 ENCOUNTER — Telehealth: Payer: 59 | Admitting: Physician Assistant

## 2022-04-23 DIAGNOSIS — K582 Mixed irritable bowel syndrome: Secondary | ICD-10-CM

## 2022-04-23 MED ORDER — DICYCLOMINE HCL 10 MG PO CAPS: 10.0000 mg | ORAL_CAPSULE | Freq: Three times a day (TID) | ORAL | 0 refills | Status: AC

## 2022-04-23 NOTE — Patient Instructions (Signed)
[name], thank you for joining Mar Daring, PA-C for today's virtual visit.  While this provider is not your primary care provider (PCP), if your PCP is located in our provider database this encounter information will be shared with them immediately following your visit.   [name] account gives you access to today's visit and all your visits, tests, and labs performed at [facility] " click here if you don't have a [facility] account or go to [email or web address]  Consent: (Patient) Jackson Trevino provided verbal consent for this virtual visit at the beginning of the encounter.  Current Medications:  Current Outpatient Medications:    dicyclomine (BENTYL) 10 MG capsule, Take 1 capsule (10 mg total) by mouth 4 (four) times daily -  before meals and at bedtime., Disp: 40 capsule, Rfl: 0   atomoxetine (STRATTERA) 18 MG capsule, Take 1 capsule (18 mg total) by mouth daily. (Patient not taking: Reported on 01/07/2022), Disp: 30 capsule, Rfl: 1   lisdexamfetamine (VYVANSE) 30 MG capsule, Take 1 capsule (30 mg total) by mouth daily. (Patient not taking: Reported on 12/25/2021), Disp: 30 capsule, Rfl: 0   loperamide (IMODIUM) 2 MG capsule, Take 2 capsules (4 mg total) by mouth as needed for diarrhea or loose stools., Disp: 30 capsule, Rfl: 0   metoprolol succinate (TOPROL-XL) 25 MG 24 hr tablet, Take 1 tablet (25 mg total) by mouth daily., Disp: 90 tablet, Rfl: 3   omeprazole (PRILOSEC) 20 MG capsule, Take 20 mg by mouth daily., Disp: , Rfl:    Medications ordered in this encounter:  Meds ordered this encounter  Medications   dicyclomine (BENTYL) 10 MG capsule    Sig: Take 1 capsule (10 mg total) by mouth 4 (four) times daily -  before meals and at bedtime.    Dispense:  40 capsule    Refill:  0    Order Specific Question:   Supervising Provider    Answer:   Chase Picket [id number]     *If you need refills on other  medications prior to your next appointment, please contact your pharmacy*  Follow-Up: Call back or seek an in-person evaluation if the symptoms worsen or if the condition fails to improve as anticipated.  [name] [phone]  Other Instructions  Diet for Irritable Bowel Syndrome When you have irritable bowel syndrome (IBS), it is very important to follow the eating habits that are best for your condition. IBS may cause various symptoms, such as pain in the abdomen, constipation, or diarrhea. Choosing the right foods can help to ease the discomfort from these symptoms. Work with your health care provider and dietitian to find the eating plan that will help to control your symptoms. What are tips for following this plan?  Keep a food diary. This will help you identify foods that cause symptoms. Write down: What you eat and when you eat it. What symptoms you have. When symptoms occur in relation to your meals, such as "pain in abdomen 2 hours after dinner." Eat your meals slowly and in a relaxed setting. Aim to eat 5-6 small meals per day. Do not skip meals. Drink enough fluid to keep your urine pale yellow. Ask your health care provider if you should take an over-the-counter probiotic to help restore healthy bacteria in your gut (digestive tract). Probiotics are foods that contain good bacteria and yeasts. Your dietitian may have specific dietary recommendations for you based on your symptoms. Your dietitian  may recommend that you: Avoid foods that cause symptoms. Talk with your dietitian about other ways to get the same nutrients that are in those problem foods. Avoid foods with gluten. Gluten is a protein that is found in rye, wheat, and barley. Eat more foods that contain soluble fiber. Examples of foods with high soluble fiber include oats, seeds, and certain fruits and vegetables. Take a fiber supplement if told by your dietitian. Reduce or avoid certain foods called  FODMAPs. These are foods that contain sugars that are hard for some people to digest. Ask your health care provider which foods to avoid. What foods should I avoid? The following are some foods and drinks that may make your symptoms worse: Fatty foods, such as french fries. Foods that contain gluten, such as pasta and cereal. Dairy products, such as milk, cheese, and ice cream. Spicy foods. Alcohol. Products with caffeine, such as coffee, tea, or chocolate. Carbonated drinks, such as soda. Foods that are high in FODMAPs. These include certain fruits and vegetables. Products with sweeteners such as honey, high fructose corn syrup, sorbitol, and mannitol. The items listed above may not be a complete list of foods and beverages you should avoid. Contact a dietitian for more information. What foods are good sources of fiber? Your health care provider or dietitian may recommend that you eat more foods that contain fiber. Fiber can help to reduce constipation and other IBS symptoms. Add foods with fiber to your diet a little at a time so your body can get used to them. Too much fiber at one time might cause gas and swelling of your abdomen. The following are some foods that are good sources of fiber: Berries, such as raspberries, strawberries, and blueberries. Tomatoes. Carrots. Brown rice. Oats. Seeds, such as chia and pumpkin seeds. The items listed above may not be a complete list of recommended sources of fiber. Contact your dietitian for more options. Where to find more information International Foundation for Functional Gastrointestinal Disorders: aboutibs.[employer] of Diabetes and Digestive and Kidney Diseases: [employer].is Summary When you have irritable bowel syndrome (IBS), it is very important to follow the eating habits that are best for your condition. IBS may cause various symptoms, such as pain in the abdomen, constipation, or diarrhea. Choosing the right foods can  help to ease the discomfort that comes from symptoms. Your health care provider or dietitian may recommend that you eat more foods that contain fiber. Keep a food diary. This will help you identify foods that cause symptoms. This information is not intended to replace advice given to you by your health care provider. Make sure you discuss any questions you have with your health care provider. Document Revised: 02/26/2021 Document Reviewed: 02/26/2021 Elsevier Patient Education  [place].    If you have been instructed to have an in-person evaluation today at a local Urgent Care facility, please use the link below. It will take you to a list of all of our available [facility], including address, phone number and hours of operation. Please do not delay care.  [facility]  If you or a family member do not have a primary care provider, use the link below to schedule a visit and establish care. When you choose a [facility] primary care physician or advanced practice provider, you gain a long-term partner in health. Find a Primary Care Provider  Learn more about [facility] in-office and virtual care options: [facility]  Now

## 2022-04-23 NOTE — Progress Notes (Signed)
Virtual Visit Consent   Jackson Trevino, you are scheduled for a virtual visit with a [facility] provider today. Just as with appointments in the office, your consent must be obtained to participate. Your consent will be active for this visit and any virtual visit you may have with one of our providers in the next 365 days. If you have a MyChart account, a copy of this consent can be sent to you electronically.  As this is a virtual visit, video technology does not allow for your provider to perform a traditional examination. This may limit your provider's ability to fully assess your condition. If your provider identifies any concerns that need to be evaluated in person or the need to arrange testing (such as labs, EKG, etc.), we will make arrangements to do so. Although advances in technology are sophisticated, we cannot ensure that it will always work on either your end or our end. If the connection with a video visit is poor, the visit may have to be switched to a telephone visit. With either a video or telephone visit, we are not always able to ensure that we have a secure connection.  By engaging in this virtual visit, you consent to the provision of healthcare and authorize for your insurance to be billed (if applicable) for the services provided during this visit. Depending on your insurance coverage, you may receive a charge related to this service.  I need to obtain your verbal consent now. Are you willing to proceed with your visit today? [name] has provided verbal consent on 04/23/2022 for a virtual visit (video or telephone). Mar Daring, PA-C  Date: 04/23/2022 5:29 PM  Virtual Visit via Video Note   I, Mar Daring, connected with  Jackson Trevino  ([id number], 11/22/2000) on 04/23/22 at  5:30 PM EST by a video-enabled telemedicine application and verified that I am speaking with the correct person using two identifiers.  Location: Patient:  Virtual Visit Location Patient: Home Provider: Virtual Visit Location Provider: Home Office   I discussed the limitations of evaluation and management by telemedicine and the availability of in person appointments. The patient expressed understanding and agreed to proceed.    History of Present Illness: Jackson Trevino is a 22 y.o. who identifies as a male who was assigned male at birth, and is being seen today for IBS. Has seen GI for this and has IBS constipation and diarrhea. Uses Imodium during more intense flares. Has never had a flare last more than a day. Does have flares that are episodic maybe once every week to every other week that are not limiting and normally improve after one loose BM. However, once a month he can have more severe episodes that are longer lasting and more limiting.   Problems:  Patient Active Problem List   Diagnosis Date Noted   Drug reaction 12/17/2021   Paroxysmal atrial fibrillation ([place]) 12/17/2021   Tachycardia 12/17/2021   Bilateral hearing loss 06/10/2021   Asperger's disorder 09/06/2020   Chronic depression 09/06/2020   Connective tissue disorder ([place]) 09/06/2020   Abnormal colonoscopy 02/28/2019   Gastroesophageal reflux disease with esophagitis without hemorrhage 02/28/2019   S/P rhinoplasty 12/08/2017   History of gastroesophageal reflux (GERD) 03/13/2011   ADHD (attention deficit hyperactivity disorder) 07/10/2009   Childhood tic disorder 05/04/2008   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/04/2008   Seasonal allergic rhinitis due to pollen 02/09/2008    Allergies:  Allergies  Allergen Reactions  Azithromycin Hives and Itching   Medications:  Current Outpatient Medications:    dicyclomine (BENTYL) 10 MG capsule, Take 1 capsule (10 mg total) by mouth 4 (four) times daily -  before meals and at bedtime., Disp: 40 capsule, Rfl: 0   atomoxetine (STRATTERA) 18 MG capsule, Take 1 capsule (18 mg total) by mouth daily.  (Patient not taking: Reported on 01/07/2022), Disp: 30 capsule, Rfl: 1   lisdexamfetamine (VYVANSE) 30 MG capsule, Take 1 capsule (30 mg total) by mouth daily. (Patient not taking: Reported on 12/25/2021), Disp: 30 capsule, Rfl: 0   loperamide (IMODIUM) 2 MG capsule, Take 2 capsules (4 mg total) by mouth as needed for diarrhea or loose stools., Disp: 30 capsule, Rfl: 0   metoprolol succinate (TOPROL-XL) 25 MG 24 hr tablet, Take 1 tablet (25 mg total) by mouth daily., Disp: 90 tablet, Rfl: 3   omeprazole (PRILOSEC) 20 MG capsule, Take 20 mg by mouth daily., Disp: , Rfl:   Observations/Objective: Patient is well-developed, well-nourished in no acute distress.  Resting comfortably at home.  Head is normocephalic, atraumatic.  No labored breathing.  Speech is clear and coherent with logical content.  Patient is alert and oriented at baseline.    Assessment and Plan: 1. Irritable bowel syndrome with both constipation and diarrhea - dicyclomine (BENTYL) 10 MG capsule; Take 1 capsule (10 mg total) by mouth 4 (four) times daily -  before meals and at bedtime.  Dispense: 40 capsule; Refill: 0  - Continue Imodium - Bentyl added for cramping - Work note provided - Follow up if continues to worsen  Follow Up Instructions: I discussed the assessment and treatment plan with the patient. The patient was provided an opportunity to ask questions and all were answered. The patient agreed with the plan and demonstrated an understanding of the instructions.  A copy of instructions were sent to the patient via MyChart unless otherwise noted below.    The patient was advised to call back or seek an in-person evaluation if the symptoms worsen or if the condition fails to improve as anticipated.  Time:  I spent 10 minutes with the patient via telehealth technology discussing the above problems/concerns.    Mar Daring, PA-C

## 2022-04-30 ENCOUNTER — Ambulatory Visit: Payer: 59 | Admitting: Emergency Medicine

## 2022-10-15 ENCOUNTER — Encounter: Payer: Self-pay | Admitting: Emergency Medicine

## 2022-10-15 ENCOUNTER — Ambulatory Visit: Payer: 59 | Admitting: Emergency Medicine

## 2022-10-15 VITALS — BP 120/72 | HR 67 | Temp 98.1°F | Ht 69.0 in | Wt 181.5 lb

## 2022-10-15 DIAGNOSIS — I4719 Other supraventricular tachycardia: Secondary | ICD-10-CM

## 2022-10-15 DIAGNOSIS — F902 Attention-deficit hyperactivity disorder, combined type: Secondary | ICD-10-CM | POA: Diagnosis not present

## 2022-10-15 HISTORY — DX: Other supraventricular tachycardia: I47.19

## 2022-10-15 LAB — CBC WITH DIFFERENTIAL/PLATELET
Basophils Absolute: 0.1 10*3/uL (ref 0.0–0.1)
Basophils Relative: 0.6 % (ref 0.0–3.0)
Eosinophils Absolute: 0.2 10*3/uL (ref 0.0–0.7)
Eosinophils Relative: 2.4 % (ref 0.0–5.0)
HCT: 46.1 % (ref 39.0–52.0)
Hemoglobin: 15.3 g/dL (ref 13.0–17.0)
Lymphocytes Relative: 40 % (ref 12.0–46.0)
Lymphs Abs: 3.2 10*3/uL (ref 0.7–4.0)
MCHC: 33.1 g/dL (ref 30.0–36.0)
MCV: 82.2 fl (ref 78.0–100.0)
Monocytes Absolute: 0.6 10*3/uL (ref 0.1–1.0)
Monocytes Relative: 7.1 % (ref 3.0–12.0)
Neutro Abs: 4 10*3/uL (ref 1.4–7.7)
Neutrophils Relative %: 49.9 % (ref 43.0–77.0)
Platelets: 232 10*3/uL (ref 150.0–400.0)
RBC: 5.62 Mil/uL (ref 4.22–5.81)
RDW: 13.9 % (ref 11.5–15.5)
WBC: 8.1 10*3/uL (ref 4.0–10.5)

## 2022-10-15 LAB — HEMOGLOBIN A1C: Hgb A1c MFr Bld: 5.6 % (ref 4.6–6.5)

## 2022-10-15 LAB — COMPREHENSIVE METABOLIC PANEL
ALT: 15 U/L (ref 0–53)
AST: 17 U/L (ref 0–37)
Albumin: 4.7 g/dL (ref 3.5–5.2)
Alkaline Phosphatase: 64 U/L (ref 39–117)
BUN: 25 mg/dL — ABNORMAL HIGH (ref 6–23)
CO2: 30 mEq/L (ref 19–32)
Calcium: 9.7 mg/dL (ref 8.4–10.5)
Chloride: 101 mEq/L (ref 96–112)
Creatinine, Ser: 1.05 mg/dL (ref 0.40–1.50)
GFR: 101.49 mL/min (ref >60.00)
Glucose, Bld: 92 mg/dL (ref 70–99)
Potassium: 4.3 mEq/L (ref 3.5–5.1)
Sodium: 137 mEq/L (ref 135–145)
Total Bilirubin: 0.5 mg/dL (ref 0.2–1.2)
Total Protein: 7.1 g/dL (ref 6.0–8.3)

## 2022-10-15 LAB — LIPID PANEL
Cholesterol: 141 mg/dL (ref 0–200)
HDL: 33.1 mg/dL — ABNORMAL LOW (ref >39.00)
LDL Cholesterol: 94 mg/dL (ref 0–99)
NonHDL: 108.26
Total CHOL/HDL Ratio: 4
Triglycerides: 73 mg/dL (ref 0.0–149.0)
VLDL: 14.6 mg/dL (ref 0.0–40.0)

## 2022-10-15 NOTE — Assessment & Plan Note (Signed)
Active and affecting quality of life Needs evaluation by [facility] deficit [facility] May need to go back on medication Cardiac arrhythmias a concern

## 2022-10-15 NOTE — Patient Instructions (Signed)
Health Maintenance, Male Adopting a healthy lifestyle and getting preventive care are important in promoting health and wellness. Ask your health care provider about: The right schedule for you to have regular tests and exams. Things you can do on your own to prevent diseases and keep yourself healthy. What should I know about diet, weight, and exercise? Eat a healthy diet  Eat a diet that includes plenty of vegetables, fruits, low-fat dairy products, and lean protein. Do not eat a lot of foods that are high in solid fats, added sugars, or sodium. Maintain a healthy weight Body mass index (BMI) is a measurement that can be used to identify possible weight problems. It estimates body fat based on height and weight. Your health care provider can help determine your BMI and help you achieve or maintain a healthy weight. Get regular exercise Get regular exercise. This is one of the most important things you can do for your health. Most adults should: Exercise for at least 150 minutes each week. The exercise should increase your heart rate and make you sweat (moderate-intensity exercise). Do strengthening exercises at least twice a week. This is in addition to the moderate-intensity exercise. Spend less time sitting. Even light physical activity can be beneficial. Watch cholesterol and blood lipids Have your blood tested for lipids and cholesterol at 20 years of age, then have this test every 5 years. You may need to have your cholesterol levels checked more often if: Your lipid or cholesterol levels are high. You are older than 21 years of age. You are at high risk for heart disease. What should I know about cancer screening? Many types of cancers can be detected early and may often be prevented. Depending on your health history and family history, you may need to have cancer screening at various ages. This may include screening for: Colorectal cancer. Prostate cancer. Skin cancer. Lung  cancer. What should I know about heart disease, diabetes, and high blood pressure? Blood pressure and heart disease High blood pressure causes heart disease and increases the risk of stroke. This is more likely to develop in people who have high blood pressure readings or are overweight. Talk with your health care provider about your target blood pressure readings. Have your blood pressure checked: Every 3-5 years if you are 18-39 years of age. Every year if you are 40 years old or older. If you are between the ages of 65 and 75 and are a current or former smoker, ask your health care provider if you should have a one-time screening for abdominal aortic aneurysm (AAA). Diabetes Have regular diabetes screenings. This checks your fasting blood sugar level. Have the screening done: Once every three years after age 45 if you are at a normal weight and have a low risk for diabetes. More often and at a younger age if you are overweight or have a high risk for diabetes. What should I know about preventing infection? Hepatitis B If you have a higher risk for hepatitis B, you should be screened for this virus. Talk with your health care provider to find out if you are at risk for hepatitis B infection. Hepatitis C Blood testing is recommended for: Everyone born from 1945 through 1965. Anyone with known risk factors for hepatitis C. Sexually transmitted infections (STIs) You should be screened each year for STIs, including gonorrhea and chlamydia, if: You are sexually active and are younger than 21 years of age. You are older than 21 years of age and your   health care provider tells you that you are at risk for this type of infection. Your sexual activity has changed since you were last screened, and you are at increased risk for chlamydia or gonorrhea. Ask your health care provider if you are at risk. Ask your health care provider about whether you are at high risk for HIV. Your health care provider  may recommend a prescription medicine to help prevent HIV infection. If you choose to take medicine to prevent HIV, you should first get tested for HIV. You should then be tested every 3 months for as long as you are taking the medicine. Follow these instructions at home: Alcohol use Do not drink alcohol if your health care provider tells you not to drink. If you drink alcohol: Limit how much you have to 0-2 drinks a day. Know how much alcohol is in your drink. In the [place] one drink equals one 12 oz bottle of beer (355 mL), one 5 oz glass of wine (148 mL), or one 1 oz glass of hard liquor (44 mL). Lifestyle Do not use any products that contain nicotine or tobacco. These products include cigarettes, chewing tobacco, and vaping devices, such as e-cigarettes. If you need help quitting, ask your health care provider. Do not use street drugs. Do not share needles. Ask your health care provider for help if you need support or information about quitting drugs. General instructions Schedule regular health, dental, and eye exams. Stay current with your vaccines. Tell your health care provider if: You often feel depressed. You have ever been abused or do not feel safe at home. Summary Adopting a healthy lifestyle and getting preventive care are important in promoting health and wellness. Follow your health care provider's instructions about healthy diet, exercising, and getting tested or screened for diseases. Follow your health care provider's instructions on monitoring your cholesterol and blood pressure. This information is not intended to replace advice given to you by your health care provider. Make sure you discuss any questions you have with your health care provider. Document Revised: 08/06/2020 Document Reviewed: 08/06/2020 Elsevier Patient Education  2024 [employer] Inc.  

## 2022-10-15 NOTE — Progress Notes (Signed)
Jackson Trevino 22 y.o.   Chief Complaint  Patient presents with   Referral    Patient wants a referral to psych for ADHD as well as cardiology.     HISTORY OF PRESENT ILLNESS: This is a 22 y.o. male here for follow-up of chronic medical conditions Has history of ADHD.  Needs referral to behavioral health. Has a history of atrial tachycardia secondary to ADHD medications.  Was evaluated by cardiologist and told he needed ablation before getting back on ADHD medications Thinks he may need to get back on medications so he may need ablation therapy No other complaints or medical concerns today.  HPI   Prior to Admission medications   Medication Sig Start Date End Date Taking? Authorizing Provider  atomoxetine (STRATTERA) 18 MG capsule Take 1 capsule (18 mg total) by mouth daily. Patient not taking: Reported on 01/07/2022 12/25/21 12/25/22  Arfeen, Phillips Grout, MD  dicyclomine (BENTYL) 10 MG capsule Take 1 capsule (10 mg total) by mouth 4 (four) times daily -  before meals and at bedtime. Patient not taking: Reported on 10/15/2022 04/23/22   Margaretann Loveless, PA-C  lisdexamfetamine (VYVANSE) 30 MG capsule Take 1 capsule (30 mg total) by mouth daily. Patient not taking: Reported on 12/25/2021 11/18/21   Arfeen, Phillips Grout, MD  loperamide (IMODIUM) 2 MG capsule Take 2 capsules (4 mg total) by mouth as needed for diarrhea or loose stools. Patient not taking: Reported on 10/15/2022 11/04/21   Margaretann Loveless, PA-C  metoprolol succinate (TOPROL-XL) 25 MG 24 hr tablet Take 1 tablet (25 mg total) by mouth daily. Patient not taking: Reported on 10/15/2022 12/17/21   Georgina Quint, MD  omeprazole (PRILOSEC) 20 MG capsule Take 20 mg by mouth daily. Patient not taking: Reported on 10/15/2022    [provider]    Allergies  Allergen Reactions   Azithromycin Hives and Itching    Patient Active Problem List   Diagnosis Date Noted   Drug reaction 12/17/2021   Paroxysmal atrial  fibrillation (HCC) 12/17/2021   Tachycardia 12/17/2021   Bilateral hearing loss 06/10/2021   Asperger's disorder 09/06/2020   Chronic depression 09/06/2020   Connective tissue disorder (HCC) 09/06/2020   Abnormal colonoscopy 02/28/2019   Gastroesophageal reflux disease with esophagitis without hemorrhage 02/28/2019   S/P rhinoplasty 12/08/2017   History of gastroesophageal reflux (GERD) 03/13/2011   ADHD (attention deficit hyperactivity disorder) 07/10/2009   Childhood tic disorder 05/04/2008   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/04/2008   Seasonal allergic rhinitis due to pollen 02/09/2008    Past Medical History:  Diagnosis Date   Arrhythmia June 2023   Self noted June first noted by provider september   Asthma Childhood   No longer have   Hypertension September 2023    Past Surgical History:  Procedure Laterality Date   RHINOPLASTY      Social History   Socioeconomic History   Marital status: Married    Spouse name: Not on file   Number of children: Not on file   Years of education: Not on file   Highest education level: Some college, no degree  Occupational History   Not on file  Tobacco Use   Smoking status: Never   Smokeless tobacco: Never  Substance and Sexual Activity   Alcohol use: Not Currently   Drug use: Never   Sexual activity: Yes    Birth control/protection: Condom, Pill  Other Topics Concern   Not on file  Social History Narrative  Not on file   Social Determinants of Health   Financial Resource Strain: Low Risk  (10/11/2022)   Overall Financial Resource Strain (CARDIA)    Difficulty of Paying Living Expenses: Not hard at all  Food Insecurity: No Food Insecurity (10/11/2022)   Hunger Vital Sign    Worried About Running Out of Food in the Last Year: Never true    Ran Out of Food in the Last Year: Never true  Transportation Needs: No Transportation Needs (10/11/2022)   PRAPARE - Administrator, Civil Service  (Medical): No    Lack of Transportation (Non-Medical): No  Physical Activity: Insufficiently Active (10/11/2022)   Exercise Vital Sign    Days of Exercise per Week: 3 days    Minutes of Exercise per Session: 10 min  Stress: Stress Concern Present (10/11/2022)   [employer] of Occupational Health - Occupational Stress Questionnaire    Feeling of Stress : To some extent  Social Connections: Moderately Integrated (10/11/2022)   Social Connection and Isolation Panel [NHANES]    Frequency of Communication with Friends and Family: Three times a week    Frequency of Social Gatherings with Friends and Family: Once a week    Attends Religious Services: More than 4 times per year    Active Member of [employer] or Organizations: No    Attends Engineer, structural: Not on file    Marital Status: Married  Catering manager Violence: Not on file    Family History  Problem Relation Age of Onset   Depression Mother    Hypertension Father    Clotting disorder Sister    Depression Maternal Grandmother      Review of Systems  Constitutional: Negative.  Negative for chills and fever.  HENT: Negative.  Negative for congestion and sore throat.   Respiratory: Negative.  Negative for cough and shortness of breath.   Cardiovascular: Negative.  Negative for chest pain and palpitations.  Gastrointestinal:  Negative for abdominal pain, diarrhea, nausea and vomiting.  Skin: Negative.  Negative for rash.  Neurological: Negative.  Negative for dizziness and headaches.  All other systems reviewed and are negative.   Vitals:   10/15/22 0916  BP: 120/72  Pulse: 67  Temp: 98.1 F (36.7 C)  SpO2: 98%    Physical Exam Vitals reviewed.  Constitutional:      Appearance: Normal appearance.  HENT:     Head: Normocephalic.  Eyes:     Extraocular Movements: Extraocular movements intact.     Pupils: Pupils are equal, round, and reactive to light.  Cardiovascular:     Rate and Rhythm: Normal rate  and regular rhythm.     Pulses: Normal pulses.     Heart sounds: Normal heart sounds.  Pulmonary:     Effort: Pulmonary effort is normal.     Breath sounds: Normal breath sounds.  Musculoskeletal:     Cervical back: No tenderness.  Lymphadenopathy:     Cervical: No cervical adenopathy.  Skin:    General: Skin is warm and dry.     Capillary Refill: Capillary refill takes less than 2 seconds.  Neurological:     General: No focal deficit present.     Mental Status: He is alert and oriented to person, place, and time.  Psychiatric:        Mood and Affect: Mood normal.        Behavior: Behavior normal.      ASSESSMENT & PLAN: A total of 42 minutes was  spent with the patient and counseling/coordination of care regarding preparing for this visit, review of most recent office visit notes, review of chronic medical conditions under management, review of all medications, review of most recent blood work results, prognosis, documentation, need for follow-up.  Problem List Items Addressed This Visit       Cardiovascular and Mediastinum   Atrial tachycardia    Normal sinus rhythm today.  Clinically stable. Normotensive. Atrial tachycardia secondary to ADHD medications in the past Patient most likely will go back on these medications in the near future May need ablation therapy as per cardiologist New referral placed today      Relevant Orders   Ambulatory referral to Cardiology   CBC with Differential/Platelet   Comprehensive metabolic panel   Lipid panel   Hemoglobin A1c     Other   ADHD (attention deficit hyperactivity disorder) - Primary    Active and affecting quality of life Needs evaluation by [facility] May need to go back on medication Cardiac arrhythmias a concern      Relevant Orders   Ambulatory referral to Psychiatry   Patient Instructions  Health Maintenance, Male Adopting a healthy lifestyle and getting preventive care are  important in promoting health and wellness. Ask your health care provider about: The right schedule for you to have regular tests and exams. Things you can do on your own to prevent diseases and keep yourself healthy. What should I know about diet, weight, and exercise? Eat a healthy diet  Eat a diet that includes plenty of vegetables, fruits, low-fat dairy products, and lean protein. Do not eat a lot of foods that are high in solid fats, added sugars, or sodium. Maintain a healthy weight Body mass index (BMI) is a measurement that can be used to identify possible weight problems. It estimates body fat based on height and weight. Your health care provider can help determine your BMI and help you achieve or maintain a healthy weight. Get regular exercise Get regular exercise. This is one of the most important things you can do for your health. Most adults should: Exercise for at least 150 minutes each week. The exercise should increase your heart rate and make you sweat (moderate-intensity exercise). Do strengthening exercises at least twice a week. This is in addition to the moderate-intensity exercise. Spend less time sitting. Even light physical activity can be beneficial. Watch cholesterol and blood lipids Have your blood tested for lipids and cholesterol at 21 years of age, then have this test every 5 years. You may need to have your cholesterol levels checked more often if: Your lipid or cholesterol levels are high. You are older than 21 years of age. You are at high risk for heart disease. What should I know about cancer screening? Many types of cancers can be detected early and may often be prevented. Depending on your health history and family history, you may need to have cancer screening at various ages. This may include screening for: Colorectal cancer. Prostate cancer. Skin cancer. Lung cancer. What should I know about heart disease, diabetes, and high blood pressure? Blood  pressure and heart disease High blood pressure causes heart disease and increases the risk of stroke. This is more likely to develop in people who have high blood pressure readings or are overweight. Talk with your health care provider about your target blood pressure readings. Have your blood pressure checked: Every 3-5 years if you are 52-2 years of age. Every year if  you are 43 years old or older. If you are between the ages of 34 and 106 and are a current or former smoker, ask your health care provider if you should have a one-time screening for abdominal aortic aneurysm (AAA). Diabetes Have regular diabetes screenings. This checks your fasting blood sugar level. Have the screening done: Once every three years after age 74 if you are at a normal weight and have a low risk for diabetes. More often and at a younger age if you are overweight or have a high risk for diabetes. What should I know about preventing infection? Hepatitis B If you have a higher risk for hepatitis B, you should be screened for this virus. Talk with your health care provider to find out if you are at risk for hepatitis B infection. Hepatitis C Blood testing is recommended for: Everyone born from [birth year] through 1964. Anyone with known risk factors for hepatitis C. Sexually transmitted infections (STIs) You should be screened each year for STIs, including gonorrhea and chlamydia, if: You are sexually active and are younger than 21 years of age. You are older than 21 years of age and your health care provider tells you that you are at risk for this type of infection. Your sexual activity has changed since you were last screened, and you are at increased risk for chlamydia or gonorrhea. Ask your health care provider if you are at risk. Ask your health care provider about whether you are at high risk for HIV. Your health care provider may recommend a prescription medicine to help prevent HIV infection. If you choose to take  medicine to prevent HIV, you should first get tested for HIV. You should then be tested every 3 months for as long as you are taking the medicine. Follow these instructions at home: Alcohol use Do not drink alcohol if your health care provider tells you not to drink. If you drink alcohol: Limit how much you have to 0-2 drinks a day. Know how much alcohol is in your drink. In the [place] one drink equals one 12 oz bottle of beer (355 mL), one 5 oz glass of wine (148 mL), or one 1 oz glass of hard liquor (44 mL). Lifestyle Do not use any products that contain nicotine or tobacco. These products include cigarettes, chewing tobacco, and vaping devices, such as e-cigarettes. If you need help quitting, ask your health care provider. Do not use street drugs. Do not share needles. Ask your health care provider for help if you need support or information about quitting drugs. General instructions Schedule regular health, dental, and eye exams. Stay current with your vaccines. Tell your health care provider if: You often feel depressed. You have ever been abused or do not feel safe at home. Summary Adopting a healthy lifestyle and getting preventive care are important in promoting health and wellness. Follow your health care provider's instructions about healthy diet, exercising, and getting tested or screened for diseases. Follow your health care provider's instructions on monitoring your cholesterol and blood pressure. This information is not intended to replace advice given to you by your health care provider. Make sure you discuss any questions you have with your health care provider. Document Revised: 08/06/2020 Document Reviewed: 08/06/2020 Elsevier Patient Education  2024 Elsevier Inc.     Edwina Barth, MD [facility]

## 2022-10-15 NOTE — Assessment & Plan Note (Signed)
Normal sinus rhythm today.  Clinically stable. Normotensive. Atrial tachycardia secondary to ADHD medications in the past Patient most likely will go back on these medications in the near future May need ablation therapy as per cardiologist New referral placed today

## 2022-10-20 ENCOUNTER — Telehealth: Payer: Self-pay | Admitting: *Deleted

## 2022-10-20 NOTE — Telephone Encounter (Signed)
Called patient and left message for patient to pick up completed forms. They will be at the front desk

## 2022-11-17 ENCOUNTER — Telehealth: Payer: Self-pay | Admitting: Emergency Medicine

## 2022-11-17 NOTE — Telephone Encounter (Signed)
Patient said he had forms mailed to him for assistance, but he has misplaced them. He would like to know if they can be re-sent. Verified address Best callback is [phone].

## 2022-11-18 NOTE — Telephone Encounter (Signed)
Called patient and informed him that the form will have to be resent

## 2022-11-24 ENCOUNTER — Encounter: Payer: Self-pay | Admitting: Emergency Medicine

## 2022-12-15 ENCOUNTER — Ambulatory Visit (HOSPITAL_COMMUNITY): Payer: 59 | Admitting: Student

## 2022-12-17 ENCOUNTER — Encounter: Payer: Self-pay | Admitting: Family Medicine

## 2022-12-17 ENCOUNTER — Telehealth: Payer: 59 | Admitting: Family Medicine

## 2022-12-17 DIAGNOSIS — J069 Acute upper respiratory infection, unspecified: Secondary | ICD-10-CM

## 2022-12-17 MED ORDER — PROMETHAZINE-DM 6.25-15 MG/5ML PO SYRP: 5.0000 mL | ORAL_SOLUTION | Freq: Four times a day (QID) | ORAL | 0 refills | Status: AC | PRN

## 2022-12-17 MED ORDER — BENZONATATE 100 MG PO CAPS: 200.0000 mg | ORAL_CAPSULE | Freq: Two times a day (BID) | ORAL | 0 refills | Status: AC | PRN

## 2022-12-17 NOTE — Progress Notes (Signed)
Virtual Visit Consent   Jackson Trevino, you are scheduled for a virtual visit with a [facility] provider today. Just as with appointments in the office, your consent must be obtained to participate. Your consent will be active for this visit and any virtual visit you may have with one of our providers in the next 365 days. If you have a MyChart account, a copy of this consent can be sent to you electronically.  As this is a virtual visit, video technology does not allow for your provider to perform a traditional examination. This may limit your provider's ability to fully assess your condition. If your provider identifies any concerns that need to be evaluated in person or the need to arrange testing (such as labs, EKG, etc.), we will make arrangements to do so. Although advances in technology are sophisticated, we cannot ensure that it will always work on either your end or our end. If the connection with a video visit is poor, the visit may have to be switched to a telephone visit. With either a video or telephone visit, we are not always able to ensure that we have a secure connection.  By engaging in this virtual visit, you consent to the provision of healthcare and authorize for your insurance to be billed (if applicable) for the services provided during this visit. Depending on your insurance coverage, you may receive a charge related to this service.  I need to obtain your verbal consent now. Are you willing to proceed with your visit today? [name] has provided verbal consent on 12/17/2022 for a virtual visit (video or telephone). Freddy Finner, NP  Date: 12/17/2022 10:30 AM  Virtual Visit via Video Note   I, Freddy Finner, connected with  Saathvik Aragonez  ([id number], 10/23/2000) on 12/17/22 at 10:30 AM EDT by a video-enabled telemedicine application and verified that I am speaking with the correct person using two identifiers.  Location: Patient: Virtual Visit  Location Patient: Home Provider: Virtual Visit Location Provider: Home Office   I discussed the limitations of evaluation and management by telemedicine and the availability of in person appointments. The patient expressed understanding and agreed to proceed.    History of Present Illness: Jackson Trevino is a 22 y.o. who identifies as a male who was assigned male at birth, and is being seen today for cold like illness  Onset was Saturday mild sore throat, Sunday started with a cough with mild productive, fatigued, body aches, loose stools. Associated symptoms are as stated above, and deep coughing and rib cage pain, fever 102.2 after the Flu shot 12 hours post. Modifying factors are day quil  Denies chest pain, shortness of breath  Flu shot yesterday  Exposure to sick contacts- unknown COVID test: neg on Monday  Vaccines: covid - original set, no boosters.    Problems:  Patient Active Problem List   Diagnosis Date Noted   Atrial tachycardia 10/15/2022   Drug reaction 12/17/2021   Paroxysmal atrial fibrillation (HCC) 12/17/2021   Tachycardia 12/17/2021   Bilateral hearing loss 06/10/2021   Asperger's disorder 09/06/2020   Chronic depression 09/06/2020   Connective tissue disorder (HCC) 09/06/2020   Abnormal colonoscopy 02/28/2019   Gastroesophageal reflux disease with esophagitis without hemorrhage 02/28/2019   S/P rhinoplasty 12/08/2017   History of gastroesophageal reflux (GERD) 03/13/2011   ADHD (attention deficit hyperactivity disorder) 07/10/2009   Childhood tic disorder 05/04/2008   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 (Patient not taking: Reported on 01/07/2022), Disp: 30  capsule, Rfl: 1   dicyclomine (BENTYL) 10 MG capsule, Take 1 capsule (10 mg total) by mouth 4 (four) times daily -  before meals and at bedtime. (Patient not taking: Reported on 10/15/2022), Disp: 40 capsule, Rfl: 0   lisdexamfetamine (VYVANSE) 30 MG capsule, Take 1 capsule (30 mg total) by mouth daily. (Patient not taking: Reported on 12/25/2021), Disp: 30 capsule, Rfl: 0   loperamide (IMODIUM) 2 MG capsule, Take 2 capsules (4 mg total) by mouth as needed for diarrhea or loose stools. (Patient not taking: Reported on 10/15/2022), Disp: 30 capsule, Rfl: 0   metoprolol succinate (TOPROL-XL) 25 MG 24 hr tablet, Take 1 tablet (25 mg total) by mouth daily. (Patient not taking: Reported on 10/15/2022), Disp: 90 tablet, Rfl: 3   omeprazole (PRILOSEC) 20 MG capsule, Take 20 mg by mouth daily. (Patient not taking: Reported on 10/15/2022), Disp: , Rfl:   Observations/Objective: Patient is well-developed, well-nourished in no acute distress.  Resting comfortably  at home.  Head is normocephalic, atraumatic.  No labored breathing.  Speech is clear and coherent with logical content.  Patient is alert and oriented at baseline.    Assessment and Plan:  1. Viral URI with cough  - promethazine-dextromethorphan (PROMETHAZINE-DM) 6.25-15 MG/5ML syrup; Take 5 mLs by mouth 4 (four) times daily as needed for cough.  Dispense: 118 mL; Refill: 0 - benzonatate (TESSALON) 100 MG capsule; Take 2 capsules (200 mg total) by mouth 2 (two) times daily as needed for cough.  Dispense: 30 capsule; Refill: 0  -retest for covid to be safe- could be feeling worse due to flu vaccine. If + send message back and we can order meds- good kidney function   URI recommendations: - Increased rest - Increasing Fluids - Acetaminophen / ibuprofen as needed for fever/pain.  - Salt water gargling, chloraseptic spray and throat lozenges - Mucinex if mucus is present and increasing.  - Saline nasal spray if congestion or if nasal passages  feel dry. - Humidifying the air.   Reviewed side effects, risks and benefits of medication.    Patient acknowledged agreement and understanding of the plan.   Past Medical, Surgical, Social History, Allergies, and Medications have been Reviewed.   Follow Up Instructions: I discussed the assessment and treatment plan with the patient. The patient was provided an opportunity to ask questions and all were answered. The patient agreed with the plan and demonstrated an understanding of the instructions.  A copy of instructions were sent to the patient via MyChart unless otherwise noted below.    The patient was advised to call back or seek an in-person evaluation if the symptoms worsen or if the condition fails to improve as anticipated.  Time:  I spent 12 minutes with the patient via telehealth technology discussing the above problems/concerns.    Freddy Finner, NP

## 2022-12-17 NOTE — Patient Instructions (Signed)
Jackson Trevino, thank you for joining Jackson Finner, NP for today's virtual visit.  While this provider is not your primary care provider (PCP), if your PCP is located in our provider database this encounter information will be shared with them immediately following your visit.   A [facility] MyChart account gives you access to today's visit and all your visits, tests, and labs performed at [facility] " click here if you don't have a [facility] MyChart account or go to [email or web address]  Consent: (Patient) Jackson Trevino provided verbal consent for this virtual visit at the beginning of the encounter.  Current Medications:  Current Outpatient Medications:    benzonatate (TESSALON) 100 MG capsule, Take 2 capsules (200 mg total) by mouth 2 (two) times daily as needed for cough., Disp: 30 capsule, Rfl: 0   promethazine-dextromethorphan (PROMETHAZINE-DM) 6.25-15 MG/5ML syrup, Take 5 mLs by mouth 4 (four) times daily as needed for cough., Disp: 118 mL, Rfl: 0   atomoxetine (STRATTERA) 18 MG capsule, Take 1 capsule (18 mg total) by mouth daily. (Patient not taking: Reported on 01/07/2022), Disp: 30 capsule, Rfl: 1   dicyclomine (BENTYL) 10 MG capsule, Take 1 capsule (10 mg total) by mouth 4 (four) times daily -  before meals and at bedtime. (Patient not taking: Reported on 10/15/2022), Disp: 40 capsule, Rfl: 0   lisdexamfetamine (VYVANSE) 30 MG capsule, Take 1 capsule (30 mg total) by mouth daily. (Patient not taking: Reported on 12/25/2021), Disp: 30 capsule, Rfl: 0   loperamide (IMODIUM) 2 MG capsule, Take 2 capsules (4 mg total) by mouth as needed for diarrhea or loose stools. (Patient not taking: Reported on 10/15/2022), Disp: 30 capsule, Rfl: 0   metoprolol succinate (TOPROL-XL) 25 MG 24 hr tablet, Take 1 tablet (25 mg total) by mouth daily. (Patient not taking: Reported on 10/15/2022), Disp: 90 tablet, Rfl: 3   omeprazole (PRILOSEC) 20 MG capsule, Take 20 mg by  mouth daily. (Patient not taking: Reported on 10/15/2022), Disp: , Rfl:    Medications ordered in this encounter:  Meds ordered this encounter  Medications   promethazine-dextromethorphan (PROMETHAZINE-DM) 6.25-15 MG/5ML syrup    Sig: Take 5 mLs by mouth 4 (four) times daily as needed for cough.    Dispense:  118 mL    Refill:  0    Order Specific Question:   Supervising Provider    Answer:   Jackson Trevino [[id number]]   benzonatate (TESSALON) 100 MG capsule    Sig: Take 2 capsules (200 mg total) by mouth 2 (two) times daily as needed for cough.    Dispense:  30 capsule    Refill:  0    Order Specific Question:   Supervising Provider    Answer:   Jackson Trevino [id number]     *If you need refills on other medications prior to your next appointment, please contact your pharmacy*  Follow-Up: Call back or seek an in-person evaluation if the symptoms worsen or if the condition fails to improve as anticipated.  [facility] [phone]  Other Instructions   - Increased rest - Increasing Fluids - Acetaminophen / ibuprofen as needed for fever/pain.  - Salt water gargling, chloraseptic spray and throat lozenges - Mucinex if mucus is present and increasing.  - Saline nasal spray if congestion or if nasal passages feel dry. - Humidifying the air.     If you have been instructed to have an in-person evaluation today at a local Urgent Care facility,  please use the link below. It will take you to a list of all of our available [facility], including address, phone number and hours of operation. Please do not delay care.  [facility]  If you or a family member do not have a primary care provider, use the link below to schedule a visit and establish care. When you choose a [facility] primary care physician or advanced practice provider, you gain a long-term partner in health. Find a Primary Care Provider  Learn more about [facility]  in-office and virtual care options:  - Get Care Now

## 2022-12-18 ENCOUNTER — Ambulatory Visit (HOSPITAL_COMMUNITY): Payer: 59

## 2022-12-31 ENCOUNTER — Encounter (HOSPITAL_COMMUNITY): Payer: Self-pay

## 2022-12-31 ENCOUNTER — Ambulatory Visit (HOSPITAL_COMMUNITY): Payer: 59 | Admitting: Student

## 2023-01-12 ENCOUNTER — Ambulatory Visit: Payer: 59 | Admitting: Cardiology

## 2023-04-26 ENCOUNTER — Telehealth: Payer: 59 | Admitting: Physician Assistant

## 2023-04-26 DIAGNOSIS — L309 Dermatitis, unspecified: Secondary | ICD-10-CM

## 2023-04-27 MED ORDER — TRIAMCINOLONE ACETONIDE 0.1 % EX CREA: 1.0000 | TOPICAL_CREAM | Freq: Two times a day (BID) | CUTANEOUS | 0 refills | Status: AC

## 2023-04-27 NOTE — Progress Notes (Signed)

## 2023-08-05 ENCOUNTER — Encounter: Payer: Self-pay | Admitting: Emergency Medicine

## 2023-08-05 ENCOUNTER — Ambulatory Visit: Admitting: Emergency Medicine

## 2023-08-05 VITALS — BP 140/82 | HR 75 | Temp 98.5°F | Ht 69.0 in | Wt 193.4 lb

## 2023-08-05 DIAGNOSIS — F902 Attention-deficit hyperactivity disorder, combined type: Secondary | ICD-10-CM | POA: Diagnosis not present

## 2023-08-05 NOTE — Progress Notes (Signed)
 Jackson Trevino 23 y.o.   Chief Complaint  Patient presents with   Medical Management of Chronic Issues    EKG for Adhd medication     HISTORY OF PRESENT ILLNESS: This is a 23 y.o. male with history of ADHD Recently started on Wellbutrin Doctor considering starting him on Adderall Requesting EKG No other complaints or medical concerns today.  HPI   Prior to Admission medications   Medication Sig Start Date End Date Taking? Authorizing Provider  benzonatate  (TESSALON ) 100 MG capsule Take 2 capsules (200 mg total) by mouth 2 (two) times daily as needed for cough. 12/17/22  Yes Lanetta Pion, NP  dicyclomine  (BENTYL ) 10 MG capsule Take 1 capsule (10 mg total) by mouth 4 (four) times daily -  before meals and at bedtime. 04/23/22  Yes Angelia Kelp, PA-C  lisdexamfetamine (VYVANSE ) 30 MG capsule Take 1 capsule (30 mg total) by mouth daily. 11/18/21  Yes Arfeen, Bronson Canny, MD  loperamide  (IMODIUM ) 2 MG capsule Take 2 capsules (4 mg total) by mouth as needed for diarrhea or loose stools. 11/04/21  Yes Angelia Kelp, PA-C  metoprolol  succinate (TOPROL -XL) 25 MG 24 hr tablet Take 1 tablet (25 mg total) by mouth daily. 12/17/21  Yes Willamae Demby, Isidro Margo, MD  omeprazole (PRILOSEC) 20 MG capsule Take 20 mg by mouth daily.   Yes [provider]  promethazine -dextromethorphan (PROMETHAZINE -DM) 6.25-15 MG/5ML syrup Take 5 mLs by mouth 4 (four) times daily as needed for cough. 12/17/22  Yes Lanetta Pion, NP  triamcinolone  cream (KENALOG ) 0.1 % Apply 1 Application topically 2 (two) times daily. 04/27/23  Yes Angelia Kelp, PA-C  atomoxetine  (STRATTERA ) 18 MG capsule Take 1 capsule (18 mg total) by mouth daily. Patient not taking: Reported on 01/07/2022 12/25/21 12/25/22  Arturo Late, MD    Allergies  Allergen Reactions   Azithromycin  Hives and Itching    Patient Active Problem List   Diagnosis Date Noted   Atrial tachycardia (HCC) 10/15/2022   Drug reaction  12/17/2021   Paroxysmal atrial fibrillation (HCC) 12/17/2021   Tachycardia 12/17/2021   Bilateral hearing loss 06/10/2021   Asperger's disorder 09/06/2020   Chronic depression 09/06/2020   Connective tissue disorder (HCC) 09/06/2020   Abnormal colonoscopy 02/28/2019   Gastroesophageal reflux disease with esophagitis without hemorrhage 02/28/2019   S/P rhinoplasty 12/08/2017   History of gastroesophageal reflux (GERD) 03/13/2011   ADHD (attention deficit hyperactivity disorder) 07/10/2009   Childhood tic disorder 05/04/2008   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  Dr. Samuel Crock managing, rx strattera      Arrhythmia June 2023   Self noted June first noted by provider september   Asperger's disorder 09/06/2020   Asthma Childhood   No longer have   Atrial tachycardia (HCC) 10/15/2022   Bilateral hearing loss 06/10/2021   Childhood tic disorder 05/04/2008   Formatting of this note might be different from the original.  Managed by Dr. 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/04/2008   Paroxysmal atrial fibrillation (HCC)  12/17/2021    Past Surgical History:  Procedure Laterality Date   RHINOPLASTY      Social History   Socioeconomic History   Marital status: Married    Spouse name: Not on file   Number of children: Not on file   Years of education: Not on file   Highest education level: 12th grade  Occupational History   Not on file  Tobacco Use   Smoking status: Never    Smokeless tobacco: Never  Substance and Sexual Activity   Alcohol use: Not Currently   Drug use: Never   Sexual activity: Yes    Birth control/protection: Condom, Pill  Other Topics Concern   Not on file  Social History Narrative   Not on file   Social Drivers of Health   Financial Resource Strain: Low Risk  (08/04/2023)   Overall Financial Resource Strain (CARDIA)    Difficulty of Paying Living Expenses: Not hard at all  Food Insecurity: No Food Insecurity (08/04/2023)   Hunger Vital Sign    Worried About Running Out of Food in the Last Year: Never true    Ran Out of Food in the Last Year: Never true  Transportation Needs: No Transportation Needs (08/04/2023)   PRAPARE - Administrator, Civil Service (Medical): No    Lack of Transportation (Non-Medical): No  Physical Activity: Insufficiently Active (08/04/2023)   Exercise Vital Sign    Days of Exercise per Week: 7 days    Minutes of Exercise per Session: 20 min  Stress: No Stress Concern Present (08/04/2023)   [employer] of Occupational Health - Occupational Stress Questionnaire    Feeling of Stress : Not at all  Social Connections: Moderately Integrated (08/04/2023)   Social Connection and Isolation Panel [NHANES]    Frequency of Communication with Friends and Family: More than three times a week    Frequency of Social Gatherings with Friends and Family: Twice a week    Attends Religious Services: More than 4 times per year    Active Member of [employer] or Organizations: No    Attends Engineer, structural: Not on file    Marital Status: Married  Catering manager Violence: Not on file    Family History  Problem Relation Age of Onset   Depression Mother    Hypertension Father    Clotting disorder Sister    Depression Maternal Grandmother      Review of Systems  Constitutional: Negative.  Negative for chills and fever.  HENT: Negative.  Negative for congestion and sore throat.   Respiratory:  Negative.  Negative for cough and shortness of breath.   Cardiovascular: Negative.  Negative for chest pain and palpitations.  Gastrointestinal:  Negative for abdominal pain, nausea and vomiting.  Genitourinary: Negative.  Negative for dysuria and hematuria.  Skin: Negative.  Negative for rash.  Neurological: Negative.  Negative for dizziness and headaches.  All other systems reviewed and are negative.   Vitals:   08/05/23 1013  BP: (!) 140/82  Pulse: 75  Temp: 98.5 F (36.9 C)  SpO2: 98%    Physical Exam Vitals reviewed.  Constitutional:      Appearance: Normal appearance.  HENT:     Head: Normocephalic.  Eyes:     Extraocular Movements: Extraocular movements intact.  Cardiovascular:     Rate and Rhythm: Normal rate.  Pulmonary:     Effort: Pulmonary effort is normal.  Skin:    General: Skin is warm and dry.  Neurological:  Mental Status: He is alert and oriented to person, place, and time.  Psychiatric:        Mood and Affect: Mood normal.        Behavior: Behavior normal.   EKG: Normal sinus rhythm with ventricular rate of 80/min.  No acute ischemic changes.  Normal EKG.   ASSESSMENT & PLAN: A total of 30 minutes was spent with the patient and counseling/coordination of care regarding preparing for this visit, review of most recent office visit notes, review of all medications, review of most recent bloodwork results, review of health maintenance items, education on nutrition, prognosis, documentation, and need for follow up.   Problem List Items Addressed This Visit       Other   ADHD (attention deficit hyperactivity disorder) - Primary   Clinically stable Wellbutrin helping Considering going back to [place]  Normal EKG today Needs to follow-up with attention deficit doctors      Patient Instructions  Health Maintenance, Male Adopting a healthy lifestyle and getting preventive care are important in promoting health and wellness. Ask your health care  provider about: The right schedule for you to have regular tests and exams. Things you can do on your own to prevent diseases and keep yourself healthy. What should I know about diet, weight, and exercise? Eat a healthy diet  Eat a diet that includes plenty of vegetables, fruits, low-fat dairy products, and lean protein. Do not eat a lot of foods that are high in solid fats, added sugars, or sodium. Maintain a healthy weight Body mass index (BMI) is a measurement that can be used to identify possible weight problems. It estimates body fat based on height and weight. Your health care provider can help determine your BMI and help you achieve or maintain a healthy weight. Get regular exercise Get regular exercise. This is one of the most important things you can do for your health. Most adults should: Exercise for at least 150 minutes each week. The exercise should increase your heart rate and make you sweat (moderate-intensity exercise). Do strengthening exercises at least twice a week. This is in addition to the moderate-intensity exercise. Spend less time sitting. Even light physical activity can be beneficial. Watch cholesterol and blood lipids Have your blood tested for lipids and cholesterol at 22 years of age, then have this test every 5 years. You may need to have your cholesterol levels checked more often if: Your lipid or cholesterol levels are high. You are older than 22 years of age. You are at high risk for heart disease. What should I know about cancer screening? Many types of cancers can be detected early and may often be prevented. Depending on your health history and family history, you may need to have cancer screening at various ages. This may include screening for: Colorectal cancer. Prostate cancer. Skin cancer. Lung cancer. What should I know about heart disease, diabetes, and high blood pressure? Blood pressure and heart disease High blood pressure causes heart  disease and increases the risk of stroke. This is more likely to develop in people who have high blood pressure readings or are overweight. Talk with your health care provider about your target blood pressure readings. Have your blood pressure checked: Every 3-5 years if you are 36-5 years of age. Every year if you are 59 years old or older. If you are between the ages of 54 and 65 and are a current or former smoker, ask your health care provider if you should have a  one-time screening for abdominal aortic aneurysm (AAA). Diabetes Have regular diabetes screenings. This checks your fasting blood sugar level. Have the screening done: Once every three years after age 3 if you are at a normal weight and have a low risk for diabetes. More often and at a younger age if you are overweight or have a high risk for diabetes. What should I know about preventing infection? Hepatitis B If you have a higher risk for hepatitis B, you should be screened for this virus. Talk with your health care provider to find out if you are at risk for hepatitis B infection. Hepatitis C Blood testing is recommended for: Everyone born from [birth year] through 1965. Anyone with known risk factors for hepatitis C. Sexually transmitted infections (STIs) You should be screened each year for STIs, including gonorrhea and chlamydia, if: You are sexually active and are younger than 22 years of age. You are older than 22 years of age and your health care provider tells you that you are at risk for this type of infection. Your sexual activity has changed since you were last screened, and you are at increased risk for chlamydia or gonorrhea. Ask your health care provider if you are at risk. Ask your health care provider about whether you are at high risk for HIV. Your health care provider may recommend a prescription medicine to help prevent HIV infection. If you choose to take medicine to prevent HIV, you should first get tested for HIV.  You should then be tested every 3 months for as long as you are taking the medicine. Follow these instructions at home: Alcohol use Do not drink alcohol if your health care provider tells you not to drink. If you drink alcohol: Limit how much you have to 0-2 drinks a day. Know how much alcohol is in your drink. In the [place] one drink equals one 12 oz bottle of beer (355 mL), one 5 oz glass of wine (148 mL), or one 1 oz glass of hard liquor (44 mL). Lifestyle Do not use any products that contain nicotine or tobacco. These products include cigarettes, chewing tobacco, and vaping devices, such as e-cigarettes. If you need help quitting, ask your health care provider. Do not use street drugs. Do not share needles. Ask your health care provider for help if you need support or information about quitting drugs. General instructions Schedule regular health, dental, and eye exams. Stay current with your vaccines. Tell your health care provider if: You often feel depressed. You have ever been abused or do not feel safe at home. Summary Adopting a healthy lifestyle and getting preventive care are important in promoting health and wellness. Follow your health care provider's instructions about healthy diet, exercising, and getting tested or screened for diseases. Follow your health care provider's instructions on monitoring your cholesterol and blood pressure. This information is not intended to replace advice given to you by your health care provider. Make sure you discuss any questions you have with your health care provider. Document Revised: 08/06/2020 Document Reviewed: 08/06/2020 Elsevier Patient Education  2024 Elsevier Inc.    Maryagnes Small, MD [facility]

## 2023-08-05 NOTE — Patient Instructions (Signed)
 Health Maintenance, Male  Adopting a healthy lifestyle and getting preventive care are important in promoting health and wellness. Ask your health care provider about:  The right schedule for you to have regular tests and exams.  Things you can do on your own to prevent diseases and keep yourself healthy.  What should I know about diet, weight, and exercise?  Eat a healthy diet    Eat a diet that includes plenty of vegetables, fruits, low-fat dairy products, and lean protein.  Do not eat a lot of foods that are high in solid fats, added sugars, or sodium.  Maintain a healthy weight  Body mass index (BMI) is a measurement that can be used to identify possible weight problems. It estimates body fat based on height and weight. Your health care provider can help determine your BMI and help you achieve or maintain a healthy weight.  Get regular exercise  Get regular exercise. This is one of the most important things you can do for your health. Most adults should:  Exercise for at least 150 minutes each week. The exercise should increase your heart rate and make you sweat (moderate-intensity exercise).  Do strengthening exercises at least twice a week. This is in addition to the moderate-intensity exercise.  Spend less time sitting. Even light physical activity can be beneficial.  Watch cholesterol and blood lipids  Have your blood tested for lipids and cholesterol at 22 years of age, then have this test every 5 years.  You may need to have your cholesterol levels checked more often if:  Your lipid or cholesterol levels are high.  You are older than 22 years of age.  You are at high risk for heart disease.  What should I know about cancer screening?  Many types of cancers can be detected early and may often be prevented. Depending on your health history and family history, you may need to have cancer screening at various ages. This may include screening for:  Colorectal cancer.  Prostate cancer.  Skin cancer.  Lung  cancer.  What should I know about heart disease, diabetes, and high blood pressure?  Blood pressure and heart disease  High blood pressure causes heart disease and increases the risk of stroke. This is more likely to develop in people who have high blood pressure readings or are overweight.  Talk with your health care provider about your target blood pressure readings.  Have your blood pressure checked:  Every 3-5 years if you are 9-95 years of age.  Every year if you are 85 years old or older.  If you are between the ages of 29 and 29 and are a current or former smoker, ask your health care provider if you should have a one-time screening for abdominal aortic aneurysm (AAA).  Diabetes  Have regular diabetes screenings. This checks your fasting blood sugar level. Have the screening done:  Once every three years after age 23 if you are at a normal weight and have a low risk for diabetes.  More often and at a younger age if you are overweight or have a high risk for diabetes.  What should I know about preventing infection?  Hepatitis B  If you have a higher risk for hepatitis B, you should be screened for this virus. Talk with your health care provider to find out if you are at risk for hepatitis B infection.  Hepatitis C  Blood testing is recommended for:  Everyone born from [birth year] through 1964.  Anyone  with known risk factors for hepatitis C.  Sexually transmitted infections (STIs)  You should be screened each year for STIs, including gonorrhea and chlamydia, if:  You are sexually active and are younger than 22 years of age.  You are older than 22 years of age and your health care provider tells you that you are at risk for this type of infection.  Your sexual activity has changed since you were last screened, and you are at increased risk for chlamydia or gonorrhea. Ask your health care provider if you are at risk.  Ask your health care provider about whether you are at high risk for HIV. Your health care provider  may recommend a prescription medicine to help prevent HIV infection. If you choose to take medicine to prevent HIV, you should first get tested for HIV. You should then be tested every 3 months for as long as you are taking the medicine.  Follow these instructions at home:  Alcohol use  Do not drink alcohol if your health care provider tells you not to drink.  If you drink alcohol:  Limit how much you have to 0-2 drinks a day.  Know how much alcohol is in your drink. In the [place] one drink equals one 12 oz bottle of beer (355 mL), one 5 oz glass of wine (148 mL), or one 1 oz glass of hard liquor (44 mL).  Lifestyle  Do not use any products that contain nicotine or tobacco. These products include cigarettes, chewing tobacco, and vaping devices, such as e-cigarettes. If you need help quitting, ask your health care provider.  Do not use street drugs.  Do not share needles.  Ask your health care provider for help if you need support or information about quitting drugs.  General instructions  Schedule regular health, dental, and eye exams.  Stay current with your vaccines.  Tell your health care provider if:  You often feel depressed.  You have ever been abused or do not feel safe at home.  Summary  Adopting a healthy lifestyle and getting preventive care are important in promoting health and wellness.  Follow your health care provider's instructions about healthy diet, exercising, and getting tested or screened for diseases.  Follow your health care provider's instructions on monitoring your cholesterol and blood pressure.  This information is not intended to replace advice given to you by your health care provider. Make sure you discuss any questions you have with your health care provider.  Document Revised: 08/06/2020 Document Reviewed: 08/06/2020  Elsevier Patient Education  2024 [employer].

## 2023-08-05 NOTE — Addendum Note (Signed)
 Addended by: Arch Beans, Reymundo Winship P on: 08/05/2023 02:01 PM   Modules accepted: Orders

## 2023-08-05 NOTE — Assessment & Plan Note (Signed)
 Clinically stable Wellbutrin helping Considering going back to [place]  Normal EKG today Needs to follow-up with attention deficit doctors

## 2023-11-12 ENCOUNTER — Encounter: Payer: Self-pay | Admitting: Emergency Medicine

## 2023-11-12 ENCOUNTER — Ambulatory Visit: Payer: Self-pay | Admitting: Emergency Medicine

## 2023-11-12 ENCOUNTER — Ambulatory Visit (INDEPENDENT_AMBULATORY_CARE_PROVIDER_SITE_OTHER): Admitting: Emergency Medicine

## 2023-11-12 VITALS — BP 122/84 | HR 79 | Temp 98.8°F | Ht 69.0 in | Wt 171.0 lb

## 2023-11-12 DIAGNOSIS — Z0000 Encounter for general adult medical examination without abnormal findings: Secondary | ICD-10-CM | POA: Diagnosis not present

## 2023-11-12 DIAGNOSIS — Z13228 Encounter for screening for other metabolic disorders: Secondary | ICD-10-CM

## 2023-11-12 DIAGNOSIS — Z13220 Encounter for screening for lipoid disorders: Secondary | ICD-10-CM

## 2023-11-12 DIAGNOSIS — Z130 Encounter for screening for diseases of the blood and blood-forming organs and certain disorders involving the immune mechanism: Secondary | ICD-10-CM | POA: Diagnosis not present

## 2023-11-12 DIAGNOSIS — Z1329 Encounter for screening for other suspected endocrine disorder: Secondary | ICD-10-CM | POA: Diagnosis not present

## 2023-11-12 LAB — CBC WITH DIFFERENTIAL/PLATELET
Basophils Absolute: 0 K/uL (ref 0.0–0.1)
Basophils Relative: 0.7 % (ref 0.0–3.0)
Eosinophils Absolute: 0.1 K/uL (ref 0.0–0.7)
Eosinophils Relative: 1.8 % (ref 0.0–5.0)
HCT: 47.8 % (ref 39.0–52.0)
Hemoglobin: 15.8 g/dL (ref 13.0–17.0)
Lymphocytes Relative: 27.6 % (ref 12.0–46.0)
Lymphs Abs: 1.8 K/uL (ref 0.7–4.0)
MCHC: 32.9 g/dL (ref 30.0–36.0)
MCV: 81.4 fl (ref 78.0–100.0)
Monocytes Absolute: 0.4 K/uL (ref 0.1–1.0)
Monocytes Relative: 6.4 % (ref 3.0–12.0)
Neutro Abs: 4.1 K/uL (ref 1.4–7.7)
Neutrophils Relative %: 63.5 % (ref 43.0–77.0)
Platelets: 232 K/uL (ref 150.0–400.0)
RBC: 5.88 Mil/uL — ABNORMAL HIGH (ref 4.22–5.81)
RDW: 13.8 % (ref 11.5–15.5)
WBC: 6.4 K/uL (ref 4.0–10.5)

## 2023-11-12 LAB — LIPID PANEL
Cholesterol: 144 mg/dL (ref 0–200)
HDL: 33.9 mg/dL — ABNORMAL LOW (ref >39.00)
LDL Cholesterol: 94 mg/dL (ref 0–99)
NonHDL: 110.18
Total CHOL/HDL Ratio: 4
Triglycerides: 81 mg/dL (ref 0.0–149.0)
VLDL: 16.2 mg/dL (ref 0.0–40.0)

## 2023-11-12 LAB — COMPREHENSIVE METABOLIC PANEL WITH GFR
ALT: 14 U/L (ref 0–53)
AST: 14 U/L (ref 0–37)
Albumin: 4.6 g/dL (ref 3.5–5.2)
Alkaline Phosphatase: 69 U/L (ref 39–117)
BUN: 13 mg/dL (ref 6–23)
CO2: 29 meq/L (ref 19–32)
Calcium: 9 mg/dL (ref 8.4–10.5)
Chloride: 103 meq/L (ref 96–112)
Creatinine, Ser: 0.9 mg/dL (ref 0.40–1.50)
GFR: 121.19 mL/min (ref >60.00)
Glucose, Bld: 83 mg/dL (ref 70–99)
Potassium: 4.2 meq/L (ref 3.5–5.1)
Sodium: 139 meq/L (ref 135–145)
Total Bilirubin: 0.6 mg/dL (ref 0.2–1.2)
Total Protein: 7.1 g/dL (ref 6.0–8.3)

## 2023-11-12 LAB — HEMOGLOBIN A1C: Hgb A1c MFr Bld: 5.9 % (ref 4.6–6.5)

## 2023-11-12 NOTE — Patient Instructions (Signed)
 Health Maintenance, Male  Adopting a healthy lifestyle and getting preventive care are important in promoting health and wellness. Ask your health care provider about:  The right schedule for you to have regular tests and exams.  Things you can do on your own to prevent diseases and keep yourself healthy.  What should I know about diet, weight, and exercise?  Eat a healthy diet    Eat a diet that includes plenty of vegetables, fruits, low-fat dairy products, and lean protein.  Do not eat a lot of foods that are high in solid fats, added sugars, or sodium.  Maintain a healthy weight  Body mass index (BMI) is a measurement that can be used to identify possible weight problems. It estimates body fat based on height and weight. Your health care provider can help determine your BMI and help you achieve or maintain a healthy weight.  Get regular exercise  Get regular exercise. This is one of the most important things you can do for your health. Most adults should:  Exercise for at least 150 minutes each week. The exercise should increase your heart rate and make you sweat (moderate-intensity exercise).  Do strengthening exercises at least twice a week. This is in addition to the moderate-intensity exercise.  Spend less time sitting. Even light physical activity can be beneficial.  Watch cholesterol and blood lipids  Have your blood tested for lipids and cholesterol at 22 years of age, then have this test every 5 years.  You may need to have your cholesterol levels checked more often if:  Your lipid or cholesterol levels are high.  You are older than 22 years of age.  You are at high risk for heart disease.  What should I know about cancer screening?  Many types of cancers can be detected early and may often be prevented. Depending on your health history and family history, you may need to have cancer screening at various ages. This may include screening for:  Colorectal cancer.  Prostate cancer.  Skin cancer.  Lung  cancer.  What should I know about heart disease, diabetes, and high blood pressure?  Blood pressure and heart disease  High blood pressure causes heart disease and increases the risk of stroke. This is more likely to develop in people who have high blood pressure readings or are overweight.  Talk with your health care provider about your target blood pressure readings.  Have your blood pressure checked:  Every 3-5 years if you are 22-52 years of age.  Every year if you are 3 years old or older.  If you are between the ages of 60 and 72 and are a current or former smoker, ask your health care provider if you should have a one-time screening for abdominal aortic aneurysm (AAA).  Diabetes  Have regular diabetes screenings. This checks your fasting blood sugar level. Have the screening done:  Once every three years after age 22 if you are at a normal weight and have a low risk for diabetes.  More often and at a younger age if you are overweight or have a high risk for diabetes.  What should I know about preventing infection?  Hepatitis B  If you have a higher risk for hepatitis B, you should be screened for this virus. Talk with your health care provider to find out if you are at risk for hepatitis B infection.  Hepatitis C  Blood testing is recommended for:  Everyone born from [birth year] through 1965.  Anyone  with known risk factors for hepatitis C.  Sexually transmitted infections (STIs)  You should be screened each year for STIs, including gonorrhea and chlamydia, if:  You are sexually active and are younger than 22 years of age.  You are older than 22 years of age and your health care provider tells you that you are at risk for this type of infection.  Your sexual activity has changed since you were last screened, and you are at increased risk for chlamydia or gonorrhea. Ask your health care provider if you are at risk.  Ask your health care provider about whether you are at high risk for HIV. Your health care provider  may recommend a prescription medicine to help prevent HIV infection. If you choose to take medicine to prevent HIV, you should first get tested for HIV. You should then be tested every 3 months for as long as you are taking the medicine.  Follow these instructions at home:  Alcohol use  Do not drink alcohol if your health care provider tells you not to drink.  If you drink alcohol:  Limit how much you have to 0-2 drinks a day.  Know how much alcohol is in your drink. In the [place] one drink equals one 12 oz bottle of beer (355 mL), one 5 oz glass of wine (148 mL), or one 1 oz glass of hard liquor (44 mL).  Lifestyle  Do not use any products that contain nicotine or tobacco. These products include cigarettes, chewing tobacco, and vaping devices, such as e-cigarettes. If you need help quitting, ask your health care provider.  Do not use street drugs.  Do not share needles.  Ask your health care provider for help if you need support or information about quitting drugs.  General instructions  Schedule regular health, dental, and eye exams.  Stay current with your vaccines.  Tell your health care provider if:  You often feel depressed.  You have ever been abused or do not feel safe at home.  Summary  Adopting a healthy lifestyle and getting preventive care are important in promoting health and wellness.  Follow your health care provider's instructions about healthy diet, exercising, and getting tested or screened for diseases.  Follow your health care provider's instructions on monitoring your cholesterol and blood pressure.  This information is not intended to replace advice given to you by your health care provider. Make sure you discuss any questions you have with your health care provider.  Document Revised: 08/06/2020 Document Reviewed: 08/06/2020  Elsevier Patient Education  2024 [employer].

## 2023-11-12 NOTE — Progress Notes (Signed)
 Unique Jackson Trevino 23 y.o.   Chief Complaint  Patient presents with   Annual Exam    Patient here for physical. No other concerns     HISTORY OF PRESENT ILLNESS: This is a 23 y.o. male here for annual exam. Staying healthy.  Has no complaints or medical concerns today.  HPI   Prior to Admission medications   Medication Sig Start Date End Date Taking? Authorizing Provider  amphetamine -dextroamphetamine  (ADDERALL XR) 25 MG 24 hr capsule Take 25 mg by mouth daily. 10/19/23  Yes [provider]  loperamide  (IMODIUM ) 2 MG capsule Take 2 capsules (4 mg total) by mouth as needed for diarrhea or loose stools. 11/04/21  Yes Vivienne Delon HERO, PA-C  omeprazole (PRILOSEC) 20 MG capsule Take 20 mg by mouth daily.   Yes [provider]  atomoxetine  (STRATTERA ) 18 MG capsule Take 1 capsule (18 mg total) by mouth daily. Patient not taking: Reported on 11/12/2023 12/25/21 12/25/22  Arfeen, Leni DASEN, MD  benzonatate  (TESSALON ) 100 MG capsule Take 2 capsules (200 mg total) by mouth 2 (two) times daily as needed for cough. Patient not taking: Reported on 11/12/2023 12/17/22   Moishe Chiquita HERO, NP  dicyclomine  (BENTYL ) 10 MG capsule Take 1 capsule (10 mg total) by mouth 4 (four) times daily -  before meals and at bedtime. Patient not taking: Reported on 11/12/2023 04/23/22   Burnette, Jennifer M, PA-C  lisdexamfetamine (VYVANSE ) 30 MG capsule Take 1 capsule (30 mg total) by mouth daily. Patient not taking: Reported on 11/12/2023 11/18/21   Arfeen, Leni DASEN, MD  metoprolol  succinate (TOPROL -XL) 25 MG 24 hr tablet Take 1 tablet (25 mg total) by mouth daily. Patient not taking: Reported on 11/12/2023 12/17/21   Purcell Emil Schanz, MD  promethazine -dextromethorphan (PROMETHAZINE -DM) 6.25-15 MG/5ML syrup Take 5 mLs by mouth 4 (four) times daily as needed for cough. Patient not taking: Reported on 11/12/2023 12/17/22   Moishe Chiquita HERO, NP  triamcinolone  cream (KENALOG ) 0.1 % Apply 1 Application  topically 2 (two) times daily. Patient not taking: Reported on 11/12/2023 04/27/23   Vivienne Delon HERO, PA-C    Allergies  Allergen Reactions   Azithromycin  Hives and Itching    Patient Active Problem List   Diagnosis Date Noted   Atrial tachycardia (HCC) 10/15/2022   Paroxysmal atrial fibrillation (HCC) 12/17/2021   Bilateral hearing loss 06/10/2021   Asperger's disorder 09/06/2020   Chronic depression 09/06/2020   Connective tissue disorder (HCC) 09/06/2020   Abnormal colonoscopy 02/28/2019   Gastroesophageal reflux disease with esophagitis without hemorrhage 02/28/2019   S/P rhinoplasty 12/08/2017   History of gastroesophageal reflux (GERD) 03/13/2011   ADHD (attention deficit hyperactivity disorder) 07/10/2009   Childhood tic disorder 05/04/2008   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  Dr. Dempsey managing, rx strattera      Arrhythmia June 2023   Self noted June first noted by provider september   Asperger's disorder 09/06/2020   Asthma Childhood   No longer have   Atrial tachycardia (HCC) 10/15/2022   Bilateral hearing loss 06/10/2021   Childhood tic disorder 05/04/2008   Formatting of this note might be different from the original.  Managed by Dr. 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   Paroxysmal atrial fibrillation (HCC) 12/17/2021    Past Surgical History:  Procedure Laterality Date   RHINOPLASTY      Social History   Socioeconomic History    Marital status: Married    Spouse name: Not on file   Number of children: Not on file   Years of education: Not on file   Highest education level: 12th grade  Occupational History   Not on file  Tobacco Use   Smoking status: Never   Smokeless tobacco: Never  Substance and Sexual Activity   Alcohol use: Not Currently   Drug use: Never   Sexual activity: Yes    Birth control/protection: Condom, Pill  Other Topics Concern   Not on file  Social History Narrative   Not on file   Social Drivers of Health   Financial Resource Strain: Low Risk  (08/04/2023)   Overall Financial Resource Strain (CARDIA)    Difficulty of Paying Living Expenses: Not hard at all  Food Insecurity: No Food Insecurity (08/04/2023)   Hunger Vital Sign    Worried About Running Out of Food in the Last Year: Never true    Ran Out of Food in the Last Year: Never true  Transportation Needs: No Transportation Needs (08/04/2023)   PRAPARE - Administrator, Civil Service (Medical): No    Lack of Transportation (Non-Medical): No  Physical Activity: Insufficiently Active (08/04/2023)   Exercise Vital Sign    Days of Exercise per Week: 7 days    Minutes of Exercise per Session: 20 min  Stress: No Stress Concern Present (08/04/2023)   [employer] of Occupational Health - Occupational Stress Questionnaire    Feeling of Stress : Not at all  Social Connections: Moderately Integrated (08/04/2023)   Social Connection and Isolation Panel    Frequency of Communication with Friends and Family: More than three times a week    Frequency of Social Gatherings with Friends and Family: Twice a week    Attends Religious Services: More than 4 times per year    Active Member of [employer] or Organizations: No    Attends Engineer, structural: Not on file    Marital Status: Married  Catering manager Violence: Not on file    Family History  Problem Relation Age of Onset   Depression Mother    Hypertension Father     Clotting disorder Sister    Depression Maternal Grandmother      Review of Systems  Constitutional: Negative.  Negative for chills and fever.  HENT: Negative.  Negative for congestion and sore throat.   Respiratory: Negative.  Negative for cough and shortness of breath.   Cardiovascular: Negative.  Negative for chest pain and palpitations.  Gastrointestinal:  Negative for abdominal pain, diarrhea, nausea and vomiting.  Genitourinary: Negative.  Negative for dysuria and hematuria.  Skin: Negative.  Negative for rash.  Neurological: Negative.  Negative for dizziness and headaches.  All other systems reviewed and are negative.   Vitals:   11/12/23 1425  BP: 122/84  Pulse: 79  Temp: 98.8 F (37.1 C)  SpO2: 100%    Physical Exam Vitals reviewed.  Constitutional:      Appearance: Normal appearance.  HENT:     Head: Normocephalic.     Right Ear: Tympanic membrane, ear  canal and external ear normal.     Left Ear: Tympanic membrane, ear canal and external ear normal.     Mouth/Throat:     Mouth: Mucous membranes are moist.     Pharynx: Oropharynx is clear.  Eyes:     Extraocular Movements: Extraocular movements intact.     Conjunctiva/sclera: Conjunctivae normal.     Pupils: Pupils are equal, round, and reactive to light.  Cardiovascular:     Rate and Rhythm: Normal rate and regular rhythm.     Pulses: Normal pulses.     Heart sounds: Normal heart sounds.  Pulmonary:     Effort: Pulmonary effort is normal.     Breath sounds: Normal breath sounds.  Abdominal:     Palpations: Abdomen is soft.     Tenderness: There is no abdominal tenderness.  Musculoskeletal:     Cervical back: No tenderness.  Lymphadenopathy:     Cervical: No cervical adenopathy.  Skin:    General: Skin is warm and dry.     Capillary Refill: Capillary refill takes less than 2 seconds.  Neurological:     General: No focal deficit present.     Mental Status: He is alert and oriented to person, place,  and time.  Psychiatric:        Mood and Affect: Mood normal.        Behavior: Behavior normal.      ASSESSMENT & PLAN: Problem List Items Addressed This Visit   None Visit Diagnoses       Routine general medical examination at a health care facility    -  Primary   Relevant Orders   CBC with Differential/Platelet   Comprehensive metabolic panel with GFR   Hemoglobin A1c   Lipid panel     Screening for deficiency anemia       Relevant Orders   CBC with Differential/Platelet     Screening for lipoid disorders       Relevant Orders   Lipid panel     Screening for endocrine, metabolic and immunity disorder       Relevant Orders   Comprehensive metabolic panel with GFR   Hemoglobin A1c      Modifiable risk factors discussed with patient. Anticipatory guidance according to age provided. The following topics were also discussed: Social Determinants of Health Smoking.  Non-smoker Diet and nutrition Benefits of exercise Cancer family history review Vaccinations review and recommendations Cardiovascular risk assessment and need for blood work Mental health including depression and anxiety Fall and accident prevention  Patient Instructions  Health Maintenance, Male Adopting a healthy lifestyle and getting preventive care are important in promoting health and wellness. Ask your health care provider about: The right schedule for you to have regular tests and exams. Things you can do on your own to prevent diseases and keep yourself healthy. What should I know about diet, weight, and exercise? Eat a healthy diet  Eat a diet that includes plenty of vegetables, fruits, low-fat dairy products, and lean protein. Do not eat a lot of foods that are high in solid fats, added sugars, or sodium. Maintain a healthy weight Body mass index (BMI) is a measurement that can be used to identify possible weight problems. It estimates body fat based on height and weight. Your health care  provider can help determine your BMI and help you achieve or maintain a healthy weight. Get regular exercise Get regular exercise. This is one of the most important things you can do for  your health. Most adults should: Exercise for at least 150 minutes each week. The exercise should increase your heart rate and make you sweat (moderate-intensity exercise). Do strengthening exercises at least twice a week. This is in addition to the moderate-intensity exercise. Spend less time sitting. Even light physical activity can be beneficial. Watch cholesterol and blood lipids Have your blood tested for lipids and cholesterol at 22 years of age, then have this test every 5 years. You may need to have your cholesterol levels checked more often if: Your lipid or cholesterol levels are high. You are older than 22 years of age. You are at high risk for heart disease. What should I know about cancer screening? Many types of cancers can be detected early and may often be prevented. Depending on your health history and family history, you may need to have cancer screening at various ages. This may include screening for: Colorectal cancer. Prostate cancer. Skin cancer. Lung cancer. What should I know about heart disease, diabetes, and high blood pressure? Blood pressure and heart disease High blood pressure causes heart disease and increases the risk of stroke. This is more likely to develop in people who have high blood pressure readings or are overweight. Talk with your health care provider about your target blood pressure readings. Have your blood pressure checked: Every 3-5 years if you are 46-46 years of age. Every year if you are 35 years old or older. If you are between the ages of 26 and 56 and are a current or former smoker, ask your health care provider if you should have a one-time screening for abdominal aortic aneurysm (AAA). Diabetes Have regular diabetes screenings. This checks your fasting  blood sugar level. Have the screening done: Once every three years after age 75 if you are at a normal weight and have a low risk for diabetes. More often and at a younger age if you are overweight or have a high risk for diabetes. What should I know about preventing infection? Hepatitis B If you have a higher risk for hepatitis B, you should be screened for this virus. Talk with your health care provider to find out if you are at risk for hepatitis B infection. Hepatitis C Blood testing is recommended for: Everyone born from [birth year] through 1965. Anyone with known risk factors for hepatitis C. Sexually transmitted infections (STIs) You should be screened each year for STIs, including gonorrhea and chlamydia, if: You are sexually active and are younger than 22 years of age. You are older than 22 years of age and your health care provider tells you that you are at risk for this type of infection. Your sexual activity has changed since you were last screened, and you are at increased risk for chlamydia or gonorrhea. Ask your health care provider if you are at risk. Ask your health care provider about whether you are at high risk for HIV. Your health care provider may recommend a prescription medicine to help prevent HIV infection. If you choose to take medicine to prevent HIV, you should first get tested for HIV. You should then be tested every 3 months for as long as you are taking the medicine. Follow these instructions at home: Alcohol use Do not drink alcohol if your health care provider tells you not to drink. If you drink alcohol: Limit how much you have to 0-2 drinks a day. Know how much alcohol is in your drink. In the [place] one drink equals one 12 oz bottle of  beer (355 mL), one 5 oz glass of wine (148 mL), or one 1 oz glass of hard liquor (44 mL). Lifestyle Do not use any products that contain nicotine or tobacco. These products include cigarettes, chewing tobacco, and vaping devices,  such as e-cigarettes. If you need help quitting, ask your health care provider. Do not use street drugs. Do not share needles. Ask your health care provider for help if you need support or information about quitting drugs. General instructions Schedule regular health, dental, and eye exams. Stay current with your vaccines. Tell your health care provider if: You often feel depressed. You have ever been abused or do not feel safe at home. Summary Adopting a healthy lifestyle and getting preventive care are important in promoting health and wellness. Follow your health care provider's instructions about healthy diet, exercising, and getting tested or screened for diseases. Follow your health care provider's instructions on monitoring your cholesterol and blood pressure. This information is not intended to replace advice given to you by your health care provider. Make sure you discuss any questions you have with your health care provider. Document Revised: 08/06/2020 Document Reviewed: 08/06/2020 Elsevier Patient Education  2024 Elsevier Inc.     Emil Schaumann, MD [facility]

## 2023-11-13 ENCOUNTER — Telehealth: Payer: Self-pay | Admitting: Emergency Medicine

## 2023-11-13 NOTE — Telephone Encounter (Signed)
 Hi,  I had a doctor's appointment yesterday and sent a form over for you so that it can be filled out by Dr. Adelaide. The appointment was for me. Jackson Trevino 18-Aug-2000. If this form can be filled out and returned to me that would be great. I need it for a health insurance discount.   Thank you!  Paperwork is with Dr. Purcell medical assistant pt will pick up when paperwork is completed. Please advise, Thanks

## 2023-11-16 NOTE — Telephone Encounter (Signed)
 Forms placed in providers office to be reviewed and completed

## 2023-12-16 ENCOUNTER — Other Ambulatory Visit: Payer: Self-pay | Admitting: Medical Genetics

## 2024-05-02 ENCOUNTER — Other Ambulatory Visit: Payer: Self-pay | Admitting: Medical Genetics

## 2024-05-02 DIAGNOSIS — Z006 Encounter for examination for normal comparison and control in clinical research program: Secondary | ICD-10-CM
